# Patient Record
Sex: Female | Born: 1945 | Race: White | Hispanic: No | Marital: Married | Smoking: Former smoker
Health system: Southern US, Community
[De-identification: ages and names within clinical notes are randomized; demographics above are authoritative.]

## PROBLEM LIST (undated history)

## (undated) DIAGNOSIS — C801 Malignant (primary) neoplasm, unspecified: Secondary | ICD-10-CM

## (undated) HISTORY — PX: ABDOMINAL HYSTERECTOMY: SHX81

---

## 2013-08-07 DIAGNOSIS — C569 Malignant neoplasm of unspecified ovary: Secondary | ICD-10-CM | POA: Insufficient documentation

## 2018-02-27 ENCOUNTER — Telehealth: Payer: Self-pay | Admitting: Hematology & Oncology

## 2018-02-27 NOTE — Telephone Encounter (Signed)
Received call from palliative care to schedule patient with Dr Marin Olp for a new patient appt. Per Dr Kerrie Buffalo she has spoken to Dr E and he has agreed to see patient as she transfers from [place]. Pt want to receive treatments closer to home. Called patent and gathered as much demographics as I could and we are awaiting medical records. Pt s aware of 3/15 at 3 pm appt dae.time

## 2018-03-08 ENCOUNTER — Ambulatory Visit (HOSPITAL_BASED_OUTPATIENT_CLINIC_OR_DEPARTMENT_OTHER)
Admission: RE | Admit: 2018-03-08 | Discharge: 2018-03-08 | Disposition: A | Discharge status: Home / Self Care | Payer: Medicare Other | Source: Ambulatory Visit | Attending: Hematology & Oncology | Admitting: Hematology & Oncology

## 2018-03-08 ENCOUNTER — Inpatient Hospital Stay: Payer: Medicare Other | Attending: Hematology & Oncology | Admitting: Hematology & Oncology

## 2018-03-08 ENCOUNTER — Other Ambulatory Visit: Payer: Self-pay

## 2018-03-08 ENCOUNTER — Inpatient Hospital Stay: Payer: Medicare Other

## 2018-03-08 ENCOUNTER — Encounter: Payer: Self-pay | Admitting: Hematology & Oncology

## 2018-03-08 VITALS — BP 129/69 | HR 80 | Temp 98.1°F | Resp 16 | Wt 172.0 lb

## 2018-03-08 DIAGNOSIS — I2510 Atherosclerotic heart disease of native coronary artery without angina pectoris: Secondary | ICD-10-CM | POA: Insufficient documentation

## 2018-03-08 DIAGNOSIS — C569 Malignant neoplasm of unspecified ovary: Secondary | ICD-10-CM

## 2018-03-08 DIAGNOSIS — R188 Other ascites: Secondary | ICD-10-CM | POA: Insufficient documentation

## 2018-03-08 DIAGNOSIS — J9811 Atelectasis: Secondary | ICD-10-CM | POA: Diagnosis not present

## 2018-03-08 DIAGNOSIS — Z87891 Personal history of nicotine dependence: Secondary | ICD-10-CM

## 2018-03-08 DIAGNOSIS — R0602 Shortness of breath: Secondary | ICD-10-CM | POA: Insufficient documentation

## 2018-03-08 DIAGNOSIS — C786 Secondary malignant neoplasm of retroperitoneum and peritoneum: Secondary | ICD-10-CM | POA: Insufficient documentation

## 2018-03-08 DIAGNOSIS — Z7982 Long term (current) use of aspirin: Secondary | ICD-10-CM | POA: Diagnosis not present

## 2018-03-08 LAB — CMP (CANCER CENTER ONLY)
ALT: 19 U/L (ref 10–47)
ANION GAP: 5 (ref 5–15)
AST: 28 U/L (ref 11–38)
Albumin: 3.5 g/dL (ref 3.5–5.0)
Alkaline Phosphatase: 76 U/L (ref 26–84)
BUN: 14 mg/dL (ref 7–22)
CHLORIDE: 106 mmol/L (ref 98–108)
CO2: 31 mmol/L (ref 18–33)
Calcium: 9.7 mg/dL (ref 8.0–10.3)
Creatinine: 0.8 mg/dL (ref 0.60–1.20)
GLUCOSE: 107 mg/dL (ref 73–118)
POTASSIUM: 4 mmol/L (ref 3.3–4.7)
SODIUM: 142 mmol/L (ref 128–145)
TOTAL PROTEIN: 6.6 g/dL (ref 6.4–8.1)
Total Bilirubin: 0.6 mg/dL (ref 0.2–1.6)

## 2018-03-08 LAB — CBC WITH DIFFERENTIAL (CANCER CENTER ONLY)
Basophils Absolute: 0 10*3/uL (ref 0.0–0.1)
Basophils Relative: 0 %
EOS ABS: 0.2 10*3/uL (ref 0.0–0.5)
EOS PCT: 4 %
HCT: 35.9 % (ref 34.8–46.6)
Hemoglobin: 11.6 g/dL (ref 11.6–15.9)
LYMPHS ABS: 1.1 10*3/uL (ref 0.9–3.3)
LYMPHS PCT: 21 %
MCH: 30.5 pg (ref 26.0–34.0)
MCHC: 32.3 g/dL (ref 32.0–36.0)
MCV: 94.5 fL (ref 81.0–101.0)
Monocytes Absolute: 0.6 10*3/uL (ref 0.1–0.9)
Monocytes Relative: 11 %
Neutro Abs: 3.5 10*3/uL (ref 1.5–6.5)
Neutrophils Relative %: 64 %
PLATELETS: 283 10*3/uL (ref 145–400)
RBC: 3.8 MIL/uL (ref 3.70–5.32)
RDW: 14.9 % (ref 11.1–15.7)
WBC: 5.4 10*3/uL (ref 3.9–10.0)

## 2018-03-08 MED ORDER — IOPAMIDOL (ISOVUE-370) INJECTION 76%
100.0000 mL | Freq: Once | INTRAVENOUS | Status: AC | PRN
  Administered 2018-03-08: 100 mL via INTRAVENOUS

## 2018-03-08 NOTE — Progress Notes (Signed)
Referral MD  Reason for Referral: Recurrent ovarian cancer with peritoneal carcinomatosis   Chief Complaint  Patient presents with  . New Patient (Initial Visit)  : I am having trouble breathing.  HPI: Mary Osborn is a very nice 72 year old white female.  She has been in decent health.  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  A CT scan was done which showed an abnormality in the left adnexal region.  There was fluid in the pelvic cul-de-sac.  Her CA 125 was reportedly elevated.  She underwent an exploratory laparotomy with TAH/BSO on 07/11/2013.  She was found to have a stage IIIc low-grade serous carcinoma involving both ovaries.  She had positive pelvic washings.  1 of 2 pelvic lymph nodes had metastatic cancer.  She underwent 6 cycles of adjuvant chemotherapy with carboplatinum/Taxol that was completed on 12/26/2014.   In June 2016, her CA 125 was rising.  CT scan showed peritoneal carcinomatosis.  She was started on Femara.  She remains on Femara until 11/2017.  A biopsy was done of a right axillary lymph node.  This showed low-grade ovarian carcinoma.  She was then started on Megace.  She had a very hard time with Megace.  Her CA-125 continue to rise.  She was started on chemotherapy with carboplatinum/Taxol on 02/21/2018.  She tried to have testing [facility] for [facility] 2018.  However there was not enough material to be sent.  She drives to [facility].  It is becoming more difficult for her to go there.  Her next treatment is on Monday.  Her main complaint has been shortness of breath.  She cannot go up a flight of stairs.  She cannot walk more than 200 feet.  She has no cough.  There is no fever.  She has no chest pain.  I looked at her CT scan report that was done back in there was nothing that was obvious with fluid in the lung.  Her heart looked okay.  She comes in with her husband.  She does not complain of any pain.  There  may be some occasional abdominal pain.  She is not having diarrhea.  She is not constipated.  There is no leg swelling.  She is had no fever.  There is been no bleeding.  She used to smoke.  She stopped a few years ago.  She probably has a 40-pack-year history of tobacco use.  There is been no rashes.  Overall, I said her performance status is ECOG 1.  History reviewed. No pertinent past medical history.:  History reviewed. No pertinent surgical history.:   Current Outpatient Medications:  .  aspirin EC 81 MG tablet, Take 81 mg by mouth daily., Disp: , Rfl:  .  atorvastatin (LIPITOR) 20 MG tablet, Take 20 mg by mouth. Take 20 mg by mouth twice a week., Disp: , Rfl:  .  b complex vitamins tablet, Take 1 tablet by mouth daily., Disp: , Rfl:  .  Coenzyme Q10 (CO Q-10 PO), Take 10 mg by mouth daily., Disp: , Rfl:  .  docusate sodium (STOOL SOFTENER) 250 MG capsule, Take 250 mg by mouth 2 (two) times daily., Disp: , Rfl:  .  levothyroxine (SYNTHROID, LEVOTHROID) 125 MCG tablet, Take 125 mcg by mouth daily before breakfast., Disp: , Rfl:  .  magnesium hydroxide (MILK OF MAGNESIA) 400 MG/5ML suspension, Take by mouth daily as needed for mild constipation. Take by mouth once daily as  needed for constipation., Disp: , Rfl:  .  magnesium oxide (MAG-OX) 400 MG tablet, Take 400 mg by mouth daily., Disp: , Rfl:  .  meloxicam (MOBIC) 15 MG tablet, Take 15 mg by mouth daily., Disp: , Rfl:  .  Misc Natural Products (OSTEO BI-FLEX JOINT SHIELD PO), Take 1 capsule by mouth daily., Disp: , Rfl:  .  Multiple Vitamin (MULTIVITAMIN) tablet, Take 1 tablet by mouth daily., Disp: , Rfl:  .  omega-3 fish oil (MAXEPA) 1000 MG CAPS capsule, Take 1 capsule by mouth daily., Disp: , Rfl:  .  sertraline (ZOLOFT) 50 MG tablet, Take 50 mg by mouth daily., Disp: , Rfl:  .  traZODone (DESYREL) 100 MG tablet, Take 100 mg by mouth at bedtime., Disp: , Rfl: :  :  Allergies  Allergen Reactions  . Codeine Nausea And  Vomiting, Nausea Only and Swelling  . Meperidine Nausea And Vomiting  . Sulfa Antibiotics Itching and Swelling  . Sulfamethoxazole Hives  . Sulfur Nausea Only  :  History reviewed. No pertinent family history.:  Social History   Socioeconomic History  . Marital status: Married    Spouse name: Not on file  . Number of children: Not on file  . Years of education: Not on file  . Highest education level: Not on file  Social Needs  . Financial resource strain: Not on file  . Food insecurity - worry: Not on file  . Food insecurity - inability: Not on file  . Transportation needs - medical: Not on file  . Transportation needs - non-medical: Not on file  Occupational History  . Not on file  Tobacco Use  . Smoking status: Former Smoker    Packs/day: 1.00    Types: Cigarettes    Last attempt to quit: 02/22/2008    Years since quitting: 10.0  . Smokeless tobacco: Never Used  Substance and Sexual Activity  . Alcohol use: Not on file  . Drug use: No  . Sexual activity: Not on file  Other Topics Concern  . Not on file  Social History Narrative  . Not on file  :  Review of Systems  Constitutional: Positive for malaise/fatigue.  HENT: Negative.   Eyes: Negative.   Respiratory: Positive for shortness of breath.   Cardiovascular: Negative.   Gastrointestinal: Positive for abdominal pain.  Genitourinary: Negative.   Musculoskeletal: Negative.   Skin: Negative.   Neurological: Negative.   Endo/Heme/Allergies: Negative.   Psychiatric/Behavioral: Negative.    .  Exam: Well-developed and well-nourished white female in no obvious distress.  Vital signs show a temperature of 98.1.  Pulse 80.  Blood pressure 129/69.  Weight is 172 pounds.  Head neck exam shows no ocular or oral lesions.  There are no palpable cervical or supraclavicular lymph nodes.  Lungs are clear bilaterally.  She has good air movement bilaterally.  No wheezes are noted.  There might be a little bit of a pleural rub  on the left.  Cardiac exam tachycardic but regular.  Abdomen is soft.  She has well healed laparoscopy scars.  There is no fluid wave.  There is no obvious abdominal mass.  She has no palpable liver or spleen tip.  Back exam shows no tenderness over the spine, ribs or hips.  Extremities shows no clubbing, cyanosis or edema.  She has no venous cord in the legs.  She has a negative Homans sign bilaterally.  Skin exam shows some scattered ecchymoses.  Neurological exam shows no focal neurological  deficits.  @IPVITALS @   Recent Labs    03/08/18 1544  WBC 5.4  HCT 35.9  PLT 283   Recent Labs    03/08/18 1544  NA 142  K 4.0  CL 106  CO2 31  GLUCOSE 107  BUN 14  CREATININE 0.80  CALCIUM 9.7    Blood smear review: None   Pathology: None     Assessment and Plan: Mary Osborn is a 72 year old white female.  She has recurrent ovarian cancer.  I think this probably would be her third recurrence.  She now is back on chemotherapy with carboplatinum/Taxol.  I am worried about this shortness of breath.  I think that we have to do a CT angiogram to make sure that there is no pulmonary emboli.  I think this would be unusual but yet I think we do have to check this.  Her renal function is okay so IV dye would be okay.  If her CT angiogram is negative for pulmonary emboli, then I would strongly consider an echocardiogram looking at her heart function.  I do not see anything clinically that would suggest congestive heart failure.  However, with her treatments that she has had, cardiac dysfunction could be a factor.  She obviously is getting great care from Dr. Theora Gianotti at [facility].  I try to reassure Mary Osborn that she is getting the same kind of treatment that I would have recommended.  She goes back to [facility] on Monday for next cycle of treatment.  We are always available for Mary Osborn.  If she does progress, then there is certainly our other options that we can consider.  She has not had  Avastin.  She has not had gemcitabine based therapy.  She is not had nirapinib which is a PARP inhibitor that has activity in patients without a BRCA mutation.  I spent about an hour with she and her husband.  Over 50% of the time was spent face-to-face with them.  We will be more than happy to see her back in the event that it becomes too difficult for her to get to [facility]

## 2018-03-09 LAB — CA 125: Cancer Antigen (CA) 125: 253.8 U/mL — ABNORMAL HIGH (ref 0.0–38.1)

## 2018-03-11 ENCOUNTER — Encounter: Payer: Self-pay | Admitting: Hematology & Oncology

## 2018-03-11 ENCOUNTER — Other Ambulatory Visit: Payer: Self-pay | Admitting: Hematology & Oncology

## 2018-03-11 DIAGNOSIS — R0602 Shortness of breath: Secondary | ICD-10-CM

## 2018-03-11 LAB — LACTATE DEHYDROGENASE: LDH: 374 U/L — AB (ref 125–245)

## 2018-03-13 ENCOUNTER — Other Ambulatory Visit: Payer: Self-pay

## 2018-03-13 ENCOUNTER — Ambulatory Visit (HOSPITAL_BASED_OUTPATIENT_CLINIC_OR_DEPARTMENT_OTHER)
Admission: RE | Admit: 2018-03-13 | Discharge: 2018-03-13 | Disposition: A | Discharge status: Home / Self Care | Payer: Medicare Other | Source: Ambulatory Visit | Attending: Hematology & Oncology | Admitting: Hematology & Oncology

## 2018-03-13 ENCOUNTER — Encounter (HOSPITAL_COMMUNITY): Payer: Self-pay | Admitting: *Deleted

## 2018-03-13 ENCOUNTER — Emergency Department (HOSPITAL_COMMUNITY): Payer: Medicare Other

## 2018-03-13 ENCOUNTER — Observation Stay (HOSPITAL_COMMUNITY)
Admission: EM | Admit: 2018-03-13 | Discharge: 2018-03-14 | Disposition: A | Discharge status: Home / Self Care | Payer: Medicare Other | Attending: Internal Medicine | Admitting: Internal Medicine

## 2018-03-13 DIAGNOSIS — J9611 Chronic respiratory failure with hypoxia: Principal | ICD-10-CM | POA: Insufficient documentation

## 2018-03-13 DIAGNOSIS — I272 Pulmonary hypertension, unspecified: Secondary | ICD-10-CM | POA: Diagnosis present

## 2018-03-13 DIAGNOSIS — C786 Secondary malignant neoplasm of retroperitoneum and peritoneum: Secondary | ICD-10-CM

## 2018-03-13 DIAGNOSIS — Z515 Encounter for palliative care: Secondary | ICD-10-CM

## 2018-03-13 DIAGNOSIS — I4581 Long QT syndrome: Secondary | ICD-10-CM | POA: Insufficient documentation

## 2018-03-13 DIAGNOSIS — R0602 Shortness of breath: Secondary | ICD-10-CM

## 2018-03-13 DIAGNOSIS — Z87891 Personal history of nicotine dependence: Secondary | ICD-10-CM | POA: Diagnosis not present

## 2018-03-13 DIAGNOSIS — R0902 Hypoxemia: Secondary | ICD-10-CM

## 2018-03-13 DIAGNOSIS — I2510 Atherosclerotic heart disease of native coronary artery without angina pectoris: Secondary | ICD-10-CM | POA: Insufficient documentation

## 2018-03-13 DIAGNOSIS — Z79890 Hormone replacement therapy (postmenopausal): Secondary | ICD-10-CM | POA: Insufficient documentation

## 2018-03-13 DIAGNOSIS — R0600 Dyspnea, unspecified: Secondary | ICD-10-CM

## 2018-03-13 DIAGNOSIS — R9431 Abnormal electrocardiogram [ECG] [EKG]: Secondary | ICD-10-CM | POA: Diagnosis present

## 2018-03-13 DIAGNOSIS — E039 Hypothyroidism, unspecified: Secondary | ICD-10-CM | POA: Insufficient documentation

## 2018-03-13 DIAGNOSIS — Z7969 Long term (current) use of other immunomodulators and immunosuppressants: Secondary | ICD-10-CM

## 2018-03-13 DIAGNOSIS — Z7982 Long term (current) use of aspirin: Secondary | ICD-10-CM | POA: Insufficient documentation

## 2018-03-13 DIAGNOSIS — Z79899 Other long term (current) drug therapy: Secondary | ICD-10-CM | POA: Diagnosis not present

## 2018-03-13 DIAGNOSIS — I071 Rheumatic tricuspid insufficiency: Secondary | ICD-10-CM | POA: Insufficient documentation

## 2018-03-13 DIAGNOSIS — R0689 Other abnormalities of breathing: Secondary | ICD-10-CM

## 2018-03-13 DIAGNOSIS — I872 Venous insufficiency (chronic) (peripheral): Secondary | ICD-10-CM

## 2018-03-13 DIAGNOSIS — C569 Malignant neoplasm of unspecified ovary: Secondary | ICD-10-CM | POA: Diagnosis not present

## 2018-03-13 DIAGNOSIS — I700 Atherosclerosis of aorta: Secondary | ICD-10-CM | POA: Diagnosis not present

## 2018-03-13 DIAGNOSIS — Z9049 Acquired absence of other specified parts of digestive tract: Secondary | ICD-10-CM | POA: Diagnosis not present

## 2018-03-13 DIAGNOSIS — N6022 Fibroadenosis of left breast: Secondary | ICD-10-CM | POA: Diagnosis present

## 2018-03-13 DIAGNOSIS — G5793 Unspecified mononeuropathy of bilateral lower limbs: Secondary | ICD-10-CM | POA: Diagnosis present

## 2018-03-13 DIAGNOSIS — Z791 Long term (current) use of non-steroidal anti-inflammatories (NSAID): Secondary | ICD-10-CM | POA: Insufficient documentation

## 2018-03-13 DIAGNOSIS — J9691 Respiratory failure, unspecified with hypoxia: Secondary | ICD-10-CM

## 2018-03-13 HISTORY — DX: Malignant (primary) neoplasm, unspecified: C80.1

## 2018-03-13 LAB — BASIC METABOLIC PANEL
Anion gap: 11 (ref 5–15)
BUN: 16 mg/dL (ref 6–20)
CALCIUM: 9.3 mg/dL (ref 8.9–10.3)
CO2: 21 mmol/L — ABNORMAL LOW (ref 22–32)
CREATININE: 0.82 mg/dL (ref 0.44–1.00)
Chloride: 104 mmol/L (ref 101–111)
GFR calc non Af Amer: >60 mL/min (ref >60)
Glucose, Bld: 129 mg/dL — ABNORMAL HIGH (ref 65–99)
Potassium: 4 mmol/L (ref 3.5–5.1)
SODIUM: 136 mmol/L (ref 135–145)

## 2018-03-13 LAB — CBC
HCT: 36.4 % (ref 36.0–46.0)
Hemoglobin: 11.7 g/dL — ABNORMAL LOW (ref 12.0–15.0)
MCH: 29.8 pg (ref 26.0–34.0)
MCHC: 32.1 g/dL (ref 30.0–36.0)
MCV: 92.6 fL (ref 78.0–100.0)
PLATELETS: 236 10*3/uL (ref 150–400)
RBC: 3.93 MIL/uL (ref 3.87–5.11)
RDW: 16.2 % — AB (ref 11.5–15.5)
WBC: 7 10*3/uL (ref 4.0–10.5)

## 2018-03-13 LAB — I-STAT TROPONIN, ED: TROPONIN I, POC: 0.05 ng/mL (ref 0.00–0.08)

## 2018-03-13 MED ORDER — ENOXAPARIN SODIUM 40 MG/0.4ML ~~LOC~~ SOLN: 40.0000 mg | Freq: Every day | SUBCUTANEOUS | Status: DC

## 2018-03-13 MED ORDER — ACETAMINOPHEN 325 MG PO TABS
650.0000 mg | ORAL_TABLET | Freq: Four times a day (QID) | ORAL | Status: DC | PRN
  Administered 2018-03-14: 650 mg via ORAL
  Filled 2018-03-13: qty 2

## 2018-03-13 MED ORDER — LEVOTHYROXINE SODIUM 25 MCG PO TABS
125.0000 ug | ORAL_TABLET | Freq: Every day | ORAL | Status: DC
  Filled 2018-03-13: qty 1

## 2018-03-13 MED ORDER — TRAZODONE HCL 100 MG PO TABS
100.0000 mg | ORAL_TABLET | Freq: Every day | ORAL | Status: DC
  Filled 2018-03-13: qty 1

## 2018-03-13 MED ORDER — SERTRALINE HCL 50 MG PO TABS
50.0000 mg | ORAL_TABLET | Freq: Every day | ORAL | Status: DC
  Filled 2018-03-13 (×2): qty 1

## 2018-03-13 MED ORDER — ACETAMINOPHEN 650 MG RE SUPP: 650.0000 mg | Freq: Four times a day (QID) | RECTAL | Status: DC | PRN

## 2018-03-13 MED ORDER — SENNOSIDES-DOCUSATE SODIUM 8.6-50 MG PO TABS: 1.0000 | ORAL_TABLET | Freq: Every evening | ORAL | Status: DC | PRN

## 2018-03-13 MED ORDER — ASPIRIN EC 81 MG PO TBEC
81.0000 mg | DELAYED_RELEASE_TABLET | Freq: Every day | ORAL | Status: DC
  Administered 2018-03-14: 81 mg via ORAL
  Filled 2018-03-13: qty 1

## 2018-03-13 NOTE — ED Triage Notes (Signed)
To ED for further eval of sob. Pt is currently receiving chemo for ovarian cancer. Due tomorrow for next dose of Taxol. Per pt and husband, pt has been evaluated for recently with CT Scan x2 to r/o PE- one in [place] and one at [facility]. This am pt had ECG at pcps office and told to come to ED for eval of abnormal result. No cp. Appears in nad. Pt found by EMT to have sat 89%. Place on 2L [place] and sats up to 93%

## 2018-03-13 NOTE — ED Provider Notes (Signed)
[place] EMERGENCY DEPARTMENT Provider Note   CSN: [id number] Arrival date & time: 03/13/18  1526     History   Chief Complaint Chief Complaint  Patient presents with  . Shortness of Breath    HPI Mary Osborn is a 72 y.o. female.    She presents for evaluation of shortness of breath ongoing for several months, and having been evaluated several times for same.  Today she had a echocardiogram done, and after getting the results, her oncologist at [facility], suggested that she come to the emergency department for evaluation and treatment.  She was at home when this recommendation arrived, and an ambulance was summoned.  She was found to be toxic, 89% on room air.  She was then placed on nasal cannula oxygen at 2 L with improvement to 93%.  The patient reports dyspnea on exertion for several weeks.  Occasionally she has periods when she has to gasp to breathe.  She is getting ongoing evaluation and treatment by oncology, for indolent ovarian cancer, and currently receiving chemotherapy.  She was evaluated 2 days ago by the oncology service at [facility], and had CTA chest to evaluate for PE which did not show PE.  She had a similar test 5 days ago here at [facility].  She has occasional cough which is nonproductive.  She has difficulty sleeping at night because of leg pain.  She takes trazodone without being able to sleep.  She denies fever, chills, cough, nausea, vomiting, focal weakness or paresthesia.  She is due for chemo tomorrow but it has been canceled.  There are no other known modifying factors.    ECHO Results today: - 1. Left ventricular systolic function is preserved visually   estimated at 60-55%. Left ventricular relaxation.   2. Moderate tricuspid regurgitation.   3. Calculated pulmonary artery systolic pressure approximately 70   mmHg. Significant pulmonary hypertension..   4. Right ventricle exhibits hypertrophy and is dilated and  systolic function appears reduced.   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  HPI  Past Medical History:  Diagnosis Date  . Cancer ([place])     There are no active problems to display for this patient.   Past Surgical History:  Procedure Laterality Date  . ABDOMINAL HYSTERECTOMY      OB History    No data available       Home Medications    Prior to Admission medications   Medication Sig Start Date End Date Taking? Authorizing Provider  aspirin EC 81 MG tablet Take 81 mg by mouth daily.   Yes [provider]  atorvastatin (LIPITOR) 20 MG tablet Take 20 mg by mouth. Take 20 mg by mouth twice a week.   Yes [provider]  b complex vitamins tablet Take 1 tablet by mouth daily.   Yes [provider]  Coenzyme Q10 (CO Q-10 PO) Take 10 mg by mouth daily.   Yes [provider]  dexamethasone (DECADRON) 4 MG tablet Take 8 mg by mouth 2 (two) times daily with a meal.   Yes [provider]  docusate sodium (STOOL SOFTENER) 250 MG capsule Take 250 mg by mouth 2 (two) times daily.   Yes [provider]  levothyroxine (SYNTHROID, LEVOTHROID) 125 MCG tablet Take 125 mcg by mouth daily before breakfast.   Yes [provider]  magnesium hydroxide (MILK OF MAGNESIA) 400 MG/5ML suspension Take by mouth daily as needed for mild constipation. Take by mouth once daily as needed  for constipation.   Yes [provider]  magnesium oxide (MAG-OX) 400 MG tablet Take 400 mg by mouth daily.   Yes [provider]  meloxicam (MOBIC) 15 MG tablet Take 15 mg by mouth at bedtime.    Yes [provider]  Misc Natural Products (OSTEO BI-FLEX JOINT SHIELD PO) Take 1 capsule by mouth at bedtime.    Yes [provider]  Multiple Vitamin (MULTIVITAMIN) tablet Take 1 tablet by mouth daily.   Yes [provider]  OLANZapine (ZYPREXA) 10 MG tablet Take 10 mg by mouth at bedtime.   Yes [provider]  omega-3 fish oil  (MAXEPA) 1000 MG CAPS capsule Take 1 capsule by mouth daily.   Yes [provider]  prochlorperazine (COMPAZINE) 10 MG tablet Take 10 mg by mouth every 6 (six) hours as needed for nausea or vomiting.   Yes [provider]  sertraline (ZOLOFT) 50 MG tablet Take 50 mg by mouth at bedtime.    Yes [provider]  traZODone (DESYREL) 100 MG tablet Take 100 mg by mouth at bedtime.   Yes [provider]    Family History No family history on file.  Social History Social History   Tobacco Use  . Smoking status: Former Smoker    Packs/day: 1.00    Types: Cigarettes    Last attempt to quit: 02/22/2008    Years since quitting: 10.0  . Smokeless tobacco: Never Used  Substance Use Topics  . Alcohol use: Not on file  . Drug use: No     Allergies   Codeine; Meperidine; Sulfa antibiotics; Sulfamethoxazole; and Sulfur   Review of Systems Review of Systems  All other systems reviewed and are negative.    Physical Exam Updated Vital Signs BP 124/82   Pulse 92   Temp (!) 97.5 F (36.4 C) (Oral)   Resp (!) 26   SpO2 94%   Physical Exam  Constitutional: She is oriented to person, place, and time. She appears well-developed and well-nourished.  HENT:  Head: Normocephalic and atraumatic.  Eyes: Conjunctivae and EOM are normal. Pupils are equal, round, and reactive to light.  Neck: Normal range of motion and phonation normal. Neck supple.  Cardiovascular: Normal rate and regular rhythm.  Pulmonary/Chest: Effort normal and breath sounds normal. She exhibits no tenderness.  Abdominal: Soft. She exhibits no distension. There is no tenderness. There is no guarding.  Musculoskeletal: Normal range of motion.  Neurological: She is alert and oriented to person, place, and time. She exhibits normal muscle tone.  Skin: Skin is warm and dry.  Psychiatric: She has a normal mood and affect. Her behavior is normal. Judgment and thought content normal.  Nursing  note and vitals reviewed.    ED Treatments / Results  Labs (all labs ordered are listed, but only abnormal results are displayed) Labs Reviewed  BASIC METABOLIC PANEL - Abnormal; Notable for the following components:      Result Value   CO2 21 (*)    Glucose, Bld 129 (*)    All other components within normal limits  CBC - Abnormal; Notable for the following components:   Hemoglobin 11.7 (*)    RDW 16.2 (*)    All other components within normal limits  I-STAT TROPONIN, ED    EKG  EKG Interpretation  Date/Time:  Wednesday March 13 2018 15:38:18 EDT Ventricular Rate:  98 PR Interval:  138 QRS Duration: 82 QT Interval:  410 QTC Calculation: 523 R Axis:   -  80 Text Interpretation:  Normal sinus rhythm Left axis deviation Anterior infarct , age undetermined T wave abnormality, consider inferior ischemia Prolonged QT Abnormal ECG No old tracing to compare Confirmed by Daleen Bo [phone]) on 03/13/2018 8:24:05 PM       Radiology Dg Chest 2 View  Result Date: 03/13/2018 CLINICAL DATA:  Shortness of breath for 6 weeks. EXAM: CHEST - 2 VIEW COMPARISON:  CT chest 03/08/2018. FINDINGS: Minimal subsegmental atelectasis left lung base noted. The lungs are otherwise clear. Heart size is normal. No pneumothorax or pleural effusion. Aortic atherosclerosis is seen. No acute bony abnormality. IMPRESSION: No acute disease. Atherosclerosis. Electronically Signed   By: Inge Rise M.D.   On: 03/13/2018 16:44    Procedures Procedures (including critical care time)  Medications Ordered in ED Medications - No data to display   Initial Impression / Assessment and Plan / ED Course  I have reviewed the triage vital signs and the nursing notes.  Pertinent labs & imaging results that were available during my care of the patient were reviewed by me and considered in my medical decision making (see chart for details).  Clinical Course as of Mar 13 2204  Wed Mar 13, 2018  2204 Evaluation  for dyspnea: Normal troponin, normal white count, hemoglobin low 11.7, normal sodium, normal potassium, normal creatinine.  Chest x-ray no acute disease.  [EW]  2205 Oxygen saturation assessment, maintains normal saturation with 3 L nasal cannula at 95%.  [EW]    Clinical Course User Index [EW] Daleen Bo, MD     Patient Vitals for the past 24 hrs:  BP Temp Temp src Pulse Resp SpO2  03/13/18 2100 124/82 - - 92 (!) 26 94 %  03/13/18 2030 122/85 - - 93 (!) 29 95 %  03/13/18 2005 117/80 - - (!) 103 19 97 %  03/13/18 1819 108/72 - - 96 16 94 %  03/13/18 1532 102/65 (!) 97.5 F (36.4 C) Oral 97 20 (!) 89 %    10:04 PM Reevaluation with update and discussion. After initial assessment and treatment, an updated evaluation reveals no change in clinical status, findings discussed with patient and husband all questions were answered. Daleen Bo   MDM-shortness of breath with hypoxia, and new finding for significant pulmonary hypertension.  Patient requires oxygen for comfort.  She can potentially have an outpatient workup for pulmonary hypertension with hypoxia, but is unable to go home without oxygen.  Case management has evaluated the patient but has been unable to get oxygen for her to leave the emergency department, with.  Therefore she will be admitted on observation status for treatment.  10:09 PM-Consult complete with on-call resident. Patient case explained and discussed.  She agrees to admit patient for further evaluation and treatment. Call ended at 10:42 PM   Final Clinical Impressions(s) / ED Diagnoses   Final diagnoses:  Hypoxia  Pulmonary hypertension [facility])    ED Discharge Orders    None       Daleen Bo, MD 03/13/18 2243

## 2018-03-13 NOTE — ED Notes (Signed)
No reply for vitals x4

## 2018-03-13 NOTE — H&P (Signed)
Date: 03/14/2018               Patient Name:  Mary Osborn MRN: [id number]  DOB: [birth year] Age / Sex: 72 y.o., female   PCP: Lemar Livings., MD         Medical Service: Internal Medicine Teaching Service         Attending Physician: Dr. Lucious Groves, DO    First Contact: Dr. Burtis Junes Pager: [phone]  Second Contact: Dr. Alphonzo Grieve Pager: [phone]       After Hours (After 5p/  First Contact Pager: [phone]  weekends / holidays): Second Contact Pager: [phone]   Chief Complaint: SOB  History of Present Illness: 72 yo female with PMH of ovarian cancer diagnosed 2014 s/p chemotherapy treatment 3 meds over last 5 years, last treatment 3 weeks ago, ?PAF.  She presents with a 6 week history of shortness of breath with exertion.  She feels short of breath just getting dressed in the morning.  She has denied any chest pain, LE swelling or pain, weight gain, orthopnea.  She endorses difficulty sleeping the last several nights but does not wake up short of breath.  She does not take long trips and has had no recent immobility.  An outpatient echo was done here at [facility] to help evaluate her shortness of breath and her oncologist from [facility] read this report and told the patient to come to the ED right away.   ED course: Pt arrived with no acute changes in her shortness of breath.  She was scheduled to be discharged for outpatient [place] workup however she was mildly hypoxemic 89% at rest.    Meds:  Current Meds  Medication Sig  . aspirin EC 81 MG tablet Take 81 mg by mouth daily.  Marland Kitchen atorvastatin (LIPITOR) 20 MG tablet Take 20 mg by mouth. Take 20 mg by mouth twice a week.  Marland Kitchen b complex vitamins tablet Take 1 tablet by mouth daily.  . Coenzyme Q10 (CO Q-10 PO) Take 10 mg by mouth daily.  Marland Kitchen dexamethasone (DECADRON) 4 MG tablet Take 8 mg by mouth 2 (two) times daily with a meal.  . docusate sodium (STOOL SOFTENER) 250 MG capsule Take 250 mg by mouth 2 (two) times daily.  Marland Kitchen  levothyroxine (SYNTHROID, LEVOTHROID) 125 MCG tablet Take 125 mcg by mouth daily before breakfast.  . magnesium hydroxide (MILK OF MAGNESIA) 400 MG/5ML suspension Take by mouth daily as needed for mild constipation. Take by mouth once daily as needed for constipation.  . magnesium oxide (MAG-OX) 400 MG tablet Take 400 mg by mouth daily.  . meloxicam (MOBIC) 15 MG tablet Take 15 mg by mouth at bedtime.   . Misc Natural Products (OSTEO BI-FLEX JOINT SHIELD PO) Take 1 capsule by mouth at bedtime.   . Multiple Vitamin (MULTIVITAMIN) tablet Take 1 tablet by mouth daily.  Marland Kitchen OLANZapine (ZYPREXA) 10 MG tablet Take 10 mg by mouth at bedtime.  Marland Kitchen omega-3 fish oil (MAXEPA) 1000 MG CAPS capsule Take 1 capsule by mouth daily.  . prochlorperazine (COMPAZINE) 10 MG tablet Take 10 mg by mouth every 6 (six) hours as needed for nausea or vomiting.  . sertraline (ZOLOFT) 50 MG tablet Take 50 mg by mouth at bedtime.   . traZODone (DESYREL) 100 MG tablet Take 100 mg by mouth at bedtime.     Allergies: Allergies as of 03/13/2018 - Review Complete 03/13/2018  Allergen Reaction Noted  . Codeine Nausea And Vomiting,  Nausea Only, and Swelling 01/01/2013  . Meperidine Nausea And Vomiting 07/09/2013  . Sulfa antibiotics Itching and Swelling   . Sulfamethoxazole Hives 01/01/2013  . Sulfur Nausea Only 07/01/2013   Past Medical History:  Diagnosis Date  . Cancer [facility]     Family History: No family history on file. Strokes in mother and grandmother both in 49's No connective tissue disease or autoimmune family history   Social History: Social History   Socioeconomic History  . Marital status: Married    Spouse name: Not on file  . Number of children: Not on file  . Years of education: Not on file  . Highest education level: Not on file  Occupational History  . Not on file  Social Needs  . Financial resource strain: Not on file  . Food insecurity:    Worry: Not on file    Inability: Not on file  .  Transportation needs:    Medical: Not on file    Non-medical: Not on file  Tobacco Use  . Smoking status: Former Smoker    Packs/day: 1.00    Types: Cigarettes    Last attempt to quit: 02/22/2008    Years since quitting: 10.0  . Smokeless tobacco: Never Used  Substance and Sexual Activity  . Alcohol use: Not on file  . Drug use: No  . Sexual activity: Not on file  Lifestyle  . Physical activity:    Days per week: Not on file    Minutes per session: Not on file  . Stress: Not on file  Relationships  . Social connections:    Talks on phone: Not on file    Gets together: Not on file    Attends religious service: Not on file    Active member of club or organization: Not on file    Attends meetings of clubs or organizations: Not on file    Relationship status: Not on file  . Intimate partner violence:    Fear of current or ex partner: Not on file    Emotionally abused: Not on file    Physically abused: Not on file    Forced sexual activity: Not on file  Other Topics Concern  . Not on file  Social History Narrative  . Not on file    Review of Systems: A complete ROS was negative except as per HPI.   Physical Exam: Blood pressure (!) 112/92, pulse (!) 103, temperature (!) 97.5 F (36.4 C), temperature source Oral, resp. rate (!) 29, SpO2 94 %. Physical Exam  Constitutional: She is oriented to person, place, and time. No distress.  HENT:  Head: Normocephalic and atraumatic.  Neck: Hepatojugular reflux and JVD (to angle of mandible) present.  Cardiovascular: Normal rate and regular rhythm. Exam reveals no gallop and no friction rub.  Distant heart sounds  Pulmonary/Chest: Effort normal and breath sounds normal. No respiratory distress. She has no wheezes. She has no rales. She exhibits no tenderness.  Abdominal: Soft. Bowel sounds are normal. She exhibits no distension and no mass. There is no tenderness. There is no rebound and no guarding.  Musculoskeletal:       Right  lower leg: She exhibits edema (trace).       Left lower leg: She exhibits edema (trace).  Neurological: She is alert and oriented to person, place, and time.  Skin: She is not diaphoretic.  Psychiatric: She has a normal mood and affect. Her behavior is normal.    EKG: personally reviewed my interpretation  is sinus rhythm, normal rate, LAD, Poor R wave progression, Prolonged QT interval  CXR: personally reviewed my interpretation is mild left lower lobe atelectasis, aortic atherosclerosis, no acute abnormality  Assessment & Plan by Problem:   PAH w/hypoxemia: uncertain etiology, based on ECHO and history alone suspicious for CTEPH but negative CTA on 15 March here at [facility] and then again on 18 March at [facility].  Had PFT's in 2014 but no results available and pt does not know results.    -will need to be discharged on home oxygen -could benefit from [place] and or V/Q scan outpatient -recommend evaluation for OSA -If pt does have Afib, could benefit from anticoagulation which would be helpful for both disease states  Ovarian cancer: last chemo treatment with taxol 3 weeks ago  -management per oncology      Dispo: Admit patient to Observation with expected length of stay less than 2 midnights.  Signed: Katherine Roan, MD 03/14/2018, 4:59 AM

## 2018-03-13 NOTE — Consult Note (Signed)
Palliative Care ED Consultation Reason: Care Coordination, Symptom Management Referred by: Patient, requested PC consultation, d/w EDP  Mrs. Skilton is a 72 yo woman with metastatic serous ovarian cancer who I briefly saw in 2014 after she had intractable nausea and prolonged hospital course following abdominal tumor debulking surgery. Since that time and after initial diagnosis Mrs. Bourn has done exceptionally well and is regularly followed at [facility]. About three weeks ago, I received a call from her daughter Carlen Rebuck, who is an Therapist, sports and personal acquaintance, that her mom had another recurrence and was getting "much worse quickly". They were struggling to make the trip to [place] for appointments and chemotherapy and wanted a second opinion from somewhere closer to home. They are from [place] and requested assistance seeing someone with [facility] in [facility] for oncology services. I spoke with Dr. Marin Olp who graciously agreed to see her for a second opinion and offered to provide her treatment in his office if that was more convienent and their choice.  Today, she had an echocardiogram done at [facility], but due to worsening shortness of breath and low O2 saturations was advised to go to the ED for evaluation. Mrs. Greb tells me that she has difficulty walking up one flight of steps, she is now short of breath even when at rest, her fatigue and weakness is severe, she wakes up at night with a gasping sensation and has trouble lying flat, additionally she complains of her legs feeling like they ache and are "crawling", they also itch and cause her significant discomfort. She has tried increasing her trazadone, using benedryl and topical steroids/creams for pain relief but nothing has helped. She notes that she got a rash on her lower legs after starting megace and believes many of her symptoms started and are related to that even though she has been off megace for 4 weeks.She also has  worsening of a rash on her left breast/it is indurated and painful intermittently-apparently has had some work up at [facility].  Her last chemotherapy was 3/18, respiratory symptoms started about a week after receiving platinum based chemotherapy on 2/19. Per ED she is now hypoxic requiring 3L nasal cannula oxygen. She had a CTscan that did not show PE or pathology to explain her hypoxia on 3/18. 2D echo showed significant pulmonary hypertension with no known etiology. Her EKG is abnormal with inverted T Waves, Prolonged QT-there is a questionable hx or dx of Afib noted by Dr. Marin Olp.   Goals of Care: 1. I spoke with Mr. And Mrs. Lieser this evening- I explained to them current medical information and also discussed possible illness trajectories and work up options ahead. They do understand that her cancer is not curable, but may still be controllable, this is her third recurrence and it does seem it is becoming more difficult to control with shorter remission periods and decreased tolerability of standard chemotherapy. I explained there are according to Dr. Antonieta Pert note many other treatment options. Additionally I explained my role as palliative care was to support them through this illness- to help her feel as good as possible so she can get to treatment if desired, live well and to plan ahead for any future needs including crisis events. I will discuss completing advance directives.  Full Scope Medical Treatment  Full Code  Pulmonary and Cardiology follow-up and consultation  They tell me they want to transfer care to [place] [facility]  Palliative will  follow only as needed   2. Symptom Management:  Dyspnea: Complex and Multifactorial: She has unexplained pulmonary Hypertension, but no PE at least two days ago- her IVC is very dilated on 2D echo. ?Afib noted, no obvious abnormalities on her chest Xray-no effusions or PNA. She does have some ground glass  appearance noted on CT read from 3/18. No fevers, +cough and is just "getting over a respiratory viral illness" may be brewing early post viral PNA, but wouldn't explain PAH. The supplemental O2 is helping- she will need this at home. D/w EDP and requesting pulmonology consult or outpatient follow.Her 12 lead is abnormal. Need to follow this. LDH up- concerning and a poor prognostic factor in malignancy. Gasping may be related to phrenic nerve irritation if metastatic  tumor is in that area- clinically she doesn't appear to have significant ascites.  Leg Discomfort: Mostly bilateral and appears to be venous insufficiency like with itching and capillaritis - I suspect there is also a component of neuropathy from chemotherapy. Could start low dose gabapentin trial. Topical traimcinolone and gentle compression stockings. If continues and benefits outweigh risks we could treat with trial of RLS meds or cymbalta.  Breast Erythema: Very concerning for possible inflammatory breat cancer-this needs complete evaluation- appears this may have been started at [facility] but records are unclear about follow-up.  Will follow inpatient as needed and help with post-discharge care coordination if needed.  Lane Hacker, DO Palliative Medicine [phone]  Time: 50 minutes Greater than 50%  of this time was spent counseling and coordinating care related to the above assessment and plan.

## 2018-03-13 NOTE — Progress Notes (Signed)
2D Echocardiogram has been performed.  Mary Osborn 03/13/2018, 10:16 AM

## 2018-03-14 ENCOUNTER — Encounter (HOSPITAL_COMMUNITY): Payer: Self-pay

## 2018-03-14 ENCOUNTER — Telehealth: Payer: Self-pay | Admitting: *Deleted

## 2018-03-14 ENCOUNTER — Other Ambulatory Visit: Payer: Self-pay

## 2018-03-14 DIAGNOSIS — R0902 Hypoxemia: Secondary | ICD-10-CM

## 2018-03-14 DIAGNOSIS — I272 Pulmonary hypertension, unspecified: Secondary | ICD-10-CM

## 2018-03-14 DIAGNOSIS — E039 Hypothyroidism, unspecified: Secondary | ICD-10-CM | POA: Diagnosis not present

## 2018-03-14 DIAGNOSIS — Z882 Allergy status to sulfonamides status: Secondary | ICD-10-CM

## 2018-03-14 DIAGNOSIS — I878 Other specified disorders of veins: Secondary | ICD-10-CM

## 2018-03-14 DIAGNOSIS — I270 Primary pulmonary hypertension: Secondary | ICD-10-CM | POA: Diagnosis not present

## 2018-03-14 DIAGNOSIS — I872 Venous insufficiency (chronic) (peripheral): Secondary | ICD-10-CM

## 2018-03-14 DIAGNOSIS — J9611 Chronic respiratory failure with hypoxia: Secondary | ICD-10-CM

## 2018-03-14 DIAGNOSIS — N6022 Fibroadenosis of left breast: Secondary | ICD-10-CM | POA: Diagnosis present

## 2018-03-14 DIAGNOSIS — Z9221 Personal history of antineoplastic chemotherapy: Secondary | ICD-10-CM

## 2018-03-14 DIAGNOSIS — Z79890 Hormone replacement therapy (postmenopausal): Secondary | ICD-10-CM | POA: Diagnosis not present

## 2018-03-14 DIAGNOSIS — R9431 Abnormal electrocardiogram [ECG] [EKG]: Secondary | ICD-10-CM | POA: Diagnosis present

## 2018-03-14 DIAGNOSIS — I4581 Long QT syndrome: Secondary | ICD-10-CM

## 2018-03-14 DIAGNOSIS — M79606 Pain in leg, unspecified: Secondary | ICD-10-CM

## 2018-03-14 DIAGNOSIS — Z91048 Other nonmedicinal substance allergy status: Secondary | ICD-10-CM

## 2018-03-14 DIAGNOSIS — Z885 Allergy status to narcotic agent status: Secondary | ICD-10-CM

## 2018-03-14 DIAGNOSIS — Z8679 Personal history of other diseases of the circulatory system: Secondary | ICD-10-CM

## 2018-03-14 DIAGNOSIS — Z79899 Other long term (current) drug therapy: Secondary | ICD-10-CM

## 2018-03-14 DIAGNOSIS — Z8543 Personal history of malignant neoplasm of ovary: Secondary | ICD-10-CM

## 2018-03-14 DIAGNOSIS — J9691 Respiratory failure, unspecified with hypoxia: Secondary | ICD-10-CM

## 2018-03-14 DIAGNOSIS — C786 Secondary malignant neoplasm of retroperitoneum and peritoneum: Secondary | ICD-10-CM

## 2018-03-14 DIAGNOSIS — G5793 Unspecified mononeuropathy of bilateral lower limbs: Secondary | ICD-10-CM | POA: Diagnosis present

## 2018-03-14 DIAGNOSIS — Z87891 Personal history of nicotine dependence: Secondary | ICD-10-CM

## 2018-03-14 LAB — BASIC METABOLIC PANEL
Anion gap: 10 (ref 5–15)
BUN: 20 mg/dL (ref 6–20)
CALCIUM: 8.6 mg/dL — AB (ref 8.9–10.3)
CHLORIDE: 105 mmol/L (ref 101–111)
CO2: 23 mmol/L (ref 22–32)
CREATININE: 0.71 mg/dL (ref 0.44–1.00)
GFR calc non Af Amer: >60 mL/min (ref >60)
Glucose, Bld: 125 mg/dL — ABNORMAL HIGH (ref 65–99)
Potassium: 3.7 mmol/L (ref 3.5–5.1)
SODIUM: 138 mmol/L (ref 135–145)

## 2018-03-14 LAB — CBC
HCT: 32.4 % — ABNORMAL LOW (ref 36.0–46.0)
Hemoglobin: 10.4 g/dL — ABNORMAL LOW (ref 12.0–15.0)
MCH: 30.1 pg (ref 26.0–34.0)
MCHC: 32.1 g/dL (ref 30.0–36.0)
MCV: 93.6 fL (ref 78.0–100.0)
PLATELETS: 181 10*3/uL (ref 150–400)
RBC: 3.46 MIL/uL — AB (ref 3.87–5.11)
RDW: 16.4 % — AB (ref 11.5–15.5)
WBC: 6.4 10*3/uL (ref 4.0–10.5)

## 2018-03-14 LAB — C-REACTIVE PROTEIN: CRP: 11.9 mg/dL — ABNORMAL HIGH (ref <1.0)

## 2018-03-14 LAB — HEPATIC FUNCTION PANEL
ALBUMIN: 3.1 g/dL — AB (ref 3.5–5.0)
ALK PHOS: 64 U/L (ref 38–126)
ALT: 14 U/L (ref 14–54)
AST: 23 U/L (ref 15–41)
Bilirubin, Direct: <0.1 mg/dL — ABNORMAL LOW (ref 0.1–0.5)
Total Bilirubin: 0.5 mg/dL (ref 0.3–1.2)
Total Protein: 5.7 g/dL — ABNORMAL LOW (ref 6.5–8.1)

## 2018-03-14 LAB — SEDIMENTATION RATE: Sed Rate: 60 mm/hr — ABNORMAL HIGH (ref 0–22)

## 2018-03-14 LAB — MAGNESIUM: Magnesium: 1.8 mg/dL (ref 1.7–2.4)

## 2018-03-14 MED ORDER — LEVOTHYROXINE SODIUM 125 MCG PO TABS
125.0000 ug | ORAL_TABLET | Freq: Every day | ORAL | Status: DC
  Filled 2018-03-14: qty 1

## 2018-03-14 NOTE — Progress Notes (Addendum)
Subjective:  Mary Osborn is feeling well today. States her trouble breathing started 6-7 weeks ago. She does not use oxygen at home. Denies chest pain and lower extremity swelling. Discussed with patient will consult Pulmonology for further recommendations. Husband present at bedside. He states Mary Osborn has been doing much better since she was placed on supplemental oxygen in the hospital.  Objective:  Vital signs in last 24 hours: Vitals:   03/13/18 2300 03/13/18 2330 03/14/18 0110 03/14/18 0613  BP: 108/77 (!) 112/92 (!) 90/57 119/81  Pulse: 97 (!) 103 (!) 103 95  Resp: (!) 26 (!) 29 (!) 21 (!) 24  Temp:   98.3 F (36.8 C) 98 F (36.7 C)  TempSrc:   Oral Oral  SpO2: 93% 94% 95% 94%  Weight:   169 lb 8.5 oz (76.9 kg)   Height:   5' (1.524 m)    GEN: Well-appearing, well-nourished very pleasant female lying in bed in NAD. Alert and oriented. RESP: Clear to auscultation bilaterally. No wheezes, rales, or rhonchi. No increased work of breathing, on 2L [place]. CV: Normal rate and regular rhythm. No murmurs, gallops, or rubs. Trace LE edema. ABD: Soft. Non-tender. Non-distended. Normoactive bowel sounds. EXT: Trace BLE edema. Warm and well perfused. NEURO: Cranial nerves II-XII grossly intact. Able to lift all four extremities against gravity. No apparent audiovisual hallucinations. Speech fluent and appropriate. PSYCH: Patient is calm and pleasant. Appropriate affect. Well-groomed; speech is appropriate and on-subject.  Assessment/Plan:  Active Problems:   Pulmonary hypertension (HCC)   Abnormal EKG - T wave inversions, Prolonged QT   On antineoplastic chemotherapy   Neuropathic pain, leg, bilateral   Venous insufficiency of both lower extremities   Diffuse fibroadenosis of left breast   Peritoneal metastases (HCC)   Respiratory failure, unspecified with hypoxia ([place])  Mary Osborn is a 72yo female with PMH of metastatic serous ovarian cancer diagnosed in 2014 s/p abdominal  tumor debulking surgery and chemotherapy (carboplatin/paclitaxel) and ?PAF who presents with 6 weeks of worsening dyspnea. Recent echo showed PAH with normal LVEF and negative CT-A (most recently on 03/18). On review of echo report, outpatient oncologist advised family to present to the ED. Her dyspnea is significantly improved on supplemental oxygen. Will need outpatient Pulm f/u for continued work-up of PAH.  Chronic respiratory failure 2/2 pulmonary HTN Unclear etiology. Trop negative, EKG without acute ischemic changes. Recent echo showed PAH and normal LVEF. Suspicious for CTEPH given active malignancy, but has had two recent CT-As negative for PE. PFTs in 2014, unable to review report. Dyspnea significantly improved with supplemental oxygen. Discussed with Pulm who recommended that most of this is outpatient work-up, however they will evaluate her and determine if anything else needs to be done while here. Ambulatory sats show drop to 89% while ambulating on RA with improvement to 95% on 2L O2. Will need home oxygen - Pulm consulted for recommendations on inpatient vs outpatient work-up; appreciate their assistance - CM consulted for assistance with home oxygen - Will hopefully be able to follow-up with Pulmonology here for continued eval (could benefit from [place], V/Q scan, or sleep study for OSA) - If pt does have Afib, could benefit from anticoagulation which would be helpful for both disease states  Recurrent Stage IIIC serous ovarian cancer, diagnosed in 2014 S/p ex-lap/TAH/BSO with sigmoid resection and re-anastomosis, left pelvic node dissection, and omentectomy in 06/2013, as well as 6 cycles of carboplatin/paclitaxel. In 2016, she developed early peritoneal carcinomatosis and started on treatment with letrozole,  then changed to megestrol in 11/2017. Re-started on carboplatin/paclitaxel every 3 weeks, has completed 1 cycle on 02/21/2018. Was due for next cycle on 03/18, but due to worsening  dyspnea on exertion, this was delayed. Follows with [place] but having a more difficult time making the drive to those appointments, so transitioning her care to [facility] with Dr. Marin Olp. - Palliative Care consulted; appreciate their assistance - Follow up with Oncology  ?PAF Unclear diagnosis. Currently in NSR. Not on anticoagulation or rate/rhythm control. - If she does have afib, could benefit from anticoagulation  Hypothyroidism - Continue home levothyroxine 178mcg daily  Dispo: Anticipated discharge in approximately 0-1 day(s).   Mary Ewing, MD 03/14/2018, 6:47 AM Pager: Mamie Nick [phone]

## 2018-03-14 NOTE — Consult Note (Addendum)
Name: Mary Osborn MRN: [id number] DOB: 27-Apr-1946    ADMISSION DATE:  03/13/2018 CONSULTATION DATE:  3/21  REFERRING MD :  Graciella Freer   CHIEF COMPLAINT:  Dyspnea, Pulm HTN   BRIEF PATIENT DESCRIPTION: 72yo female with hx recurrent metastatic stage III ovarian ca dx 2014 s/p chemotherapy (last rx 3 weeks ago), Probable PAF presented 3/20 with 6 week hx progressive SOB with exertion.  An outpt echo was performed to evaluate her dyspnea which revealed Pulm HTN with PASP 64mmHg and her oncologist called and told her to come to ER right away.   SIGNIFICANT EVENTS    STUDIES:  CTA chest 3/18 ([facility])>> 1. No evidence of pulmonary embolism.  2. Minimal scattered linear and groundglass opacities likely representing  atelectasis or scar.  3. Persistent ascites and sequela of known peritoneal neoplastic disease. 2D echo 3/20>>>- 1. Left ventricular systolic function is preserved visually   estimated at 60-55%. Left ventricular relaxation.   2. Moderate tricuspid regurgitation.   3. Calculated pulmonary artery systolic pressure approximately 70   mmHg. Significant pulmonary hypertension..   4. Right ventricle exhibits hypertrophy and is dilated and   systolic function appears reduced.   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   HISTORY OF PRESENT ILLNESS:  72yo female with hx metastatic ovarian ca dx 2014 s/p chemotherapy (last rx 3 weeks ago) presented 3/20 with 6 week hx progressive SOB with exertion.  An outpt echo was performed to evaluate her dyspnea which revealed Pulm HTN with PASP 44mmHg and her oncologist called and told her to come to ER right away.   In ER no acute changes in her SOB.  She was originally scheduled to be d/c for outpt PAH w/u but had mild hypoxemia at rest with sats 89%.   Denies chest pain, hemoptysis, cough, purulent sputum, fever, leg/calf pain.     PAST MEDICAL HISTORY :   has a past medical history of Cancer ([place]).  has a past surgical history that includes Abdominal  hysterectomy. Prior to Admission medications   Medication Sig Start Date End Date Taking? Authorizing Provider  aspirin EC 81 MG tablet Take 81 mg by mouth daily.   Yes [provider]  atorvastatin (LIPITOR) 20 MG tablet Take 20 mg by mouth. Take 20 mg by mouth twice a week.   Yes [provider]  b complex vitamins tablet Take 1 tablet by mouth daily.   Yes [provider]  Coenzyme Q10 (CO Q-10 PO) Take 10 mg by mouth daily.   Yes [provider]  dexamethasone (DECADRON) 4 MG tablet Take 8 mg by mouth 2 (two) times daily with a meal.   Yes [provider]  docusate sodium (STOOL SOFTENER) 250 MG capsule Take 250 mg by mouth 2 (two) times daily.   Yes [provider]  levothyroxine (SYNTHROID, LEVOTHROID) 125 MCG tablet Take 125 mcg by mouth daily before breakfast.   Yes [provider]  magnesium hydroxide (MILK OF MAGNESIA) 400 MG/5ML suspension Take by mouth daily as needed for mild constipation. Take by mouth once daily as needed for constipation.   Yes [provider]  magnesium oxide (MAG-OX) 400 MG tablet Take 400 mg by mouth daily.   Yes [provider]  meloxicam (MOBIC) 15 MG tablet Take 15 mg by mouth at bedtime.    Yes [provider]  Misc Natural Products (OSTEO BI-FLEX JOINT SHIELD PO) Take 1 capsule by mouth at bedtime.    Yes [provider]  Multiple  Vitamin (MULTIVITAMIN) tablet Take 1 tablet by mouth daily.   Yes [provider]  OLANZapine (ZYPREXA) 10 MG tablet Take 10 mg by mouth at bedtime.   Yes [provider]  omega-3 fish oil (MAXEPA) 1000 MG CAPS capsule Take 1 capsule by mouth daily.   Yes [provider]  prochlorperazine (COMPAZINE) 10 MG tablet Take 10 mg by mouth every 6 (six) hours as needed for nausea or vomiting.   Yes [provider]  sertraline (ZOLOFT) 50 MG tablet Take 50 mg by mouth at bedtime.    Yes [provider]  traZODone (DESYREL) 100 MG tablet Take 100 mg by mouth at bedtime.   Yes [provider]   Allergies  Allergen Reactions  . Codeine Nausea And Vomiting, Nausea Only and Swelling  . Meperidine Nausea And Vomiting  . Sulfa Antibiotics Itching and Swelling  . Sulfamethoxazole Hives  . Sulfur Nausea Only    FAMILY HISTORY:  family history is not on file. SOCIAL HISTORY:  reports that she quit smoking about 10 years ago. Her smoking use included cigarettes. She smoked 1.00 pack per day. She has never used smokeless tobacco. She reports that she does not use drugs.  REVIEW OF SYSTEMS:   As per HPI - All other systems reviewed and were neg.    SUBJECTIVE:   VITAL SIGNS: Temp:  [97.5 F (36.4 C)-98.3 F (36.8 C)] 98 F (36.7 C) (03/21 1779) Pulse Rate:  [92-103] 95 (03/21 0613) Resp:  [16-29] 24 (03/21 3903) BP: (90-124)/(57-92) 119/81 (03/21 0092) SpO2:  [89 %-97 %] 94 % (03/21 0613) Weight:  [76.9 kg (169 lb 8.5 oz)] 76.9 kg (169 lb 8.5 oz) (03/21 0110)  PHYSICAL EXAMINATION: General:  chronically ill appearing female, NAD  Neuro:  Awake, alert, appropriate  HEENT:  Mm moist, no JVD  Cardiovascular:  s1s2 rrr Lungs:  resps even non labored on [place]  Abdomen:  Round, soft, +bs  Musculoskeletal:  Warm and dry, no sig edema   Recent Labs  Lab 03/08/18 1544 03/13/18 1548 03/14/18 0335  NA 142 136 138  K 4.0 4.0 3.7  CL 106 104 105  CO2 31 21* 23  BUN 14 16 20   CREATININE 0.80 0.82 0.71  GLUCOSE 107 129* 125*   Recent Labs  Lab 03/08/18 1544 03/13/18 1548 03/14/18 0335  HGB  --  11.7* 10.4*  HCT 35.9 36.4 32.4*  WBC 5.4 7.0 6.4  PLT 283 236 181   Dg Chest 2 View  Result Date: 03/13/2018 CLINICAL DATA:  Shortness of breath for 6 weeks. EXAM: CHEST - 2 VIEW COMPARISON:  CT chest 03/08/2018. FINDINGS: Minimal subsegmental atelectasis left lung base noted. The lungs are otherwise clear. Heart size is normal. No pneumothorax or pleural  effusion. Aortic atherosclerosis is seen. No acute bony abnormality. IMPRESSION: No acute disease. Atherosclerosis. Electronically Signed   By: Inge Rise M.D.   On: 03/13/2018 16:44    ASSESSMENT / PLAN:  Hypoxia  PAH - PASP 26mmHg. ?chronic thromboembolic disease given active malignancy but CTA neg for PE. No known hx autoimmune disease, OSA, CHF.  ?PAF - currently in NSR  Recurrent metastatic ovarian cancer - current chemotherapy   PLAN  outpt pulm w/u -- appt made with Dr. Lamonte Sakai on 04/02/17  Will go ahead and order autoimmune labs  Home O2 - ambulatory desat study noted  F/u CXR   Nickolas Madrid, NP 03/14/2018  2:04 PM Pager: [phone] or [phone]  Attending Note:  72 year old female with ovarian cancer who presents to the hospital from oncologist office for evaluation of her pulmonary HTN that was noted on an echo ordered by H/O MD.  On exam, CTA bilaterally, no clubbing.  I reviewed chest CT myself, no PE noted.  Discussed with PCCM-NP and Dr. Hilma Favors.  Pulmonary HTN:  - O2 is the only treatment for now  - Autoimmune work-up sent  Hypoxemia:  - Titrate O2 of sat of 90% given pulmonary HTN  PAF:  - Rate control to avoid pulmonary edema  PCCM will sign off, please call back if needed.  Patient seen and examined, agree with above note.  I dictated the care and orders written for this patient under my direction.  Rush Farmer, [place]

## 2018-03-14 NOTE — Telephone Encounter (Addendum)
Patient's daughter is aware of results.   ----- Message from Volanda Napoleon, MD sent at 03/13/2018  2:31 PM EDT ----- Call - your heart is pumping great!!!  No issues except the atrial fibrillation!!!  Mary Osborn

## 2018-03-14 NOTE — Progress Notes (Signed)
SATURATION QUALIFICATIONS: (This note is used to comply with regulatory documentation for home oxygen)  Patient Saturations on Room Air at Rest = 91%  Patient Saturations on Room Air while Ambulating = 85%  Patient Saturations on 2 Liters of oxygen while Ambulating = 96%  Please briefly explain why patient needs home oxygen:    Patient walked a greater distance than previously.  Saturations dropped to 85% and patient had to sit before continuing back to room.

## 2018-03-14 NOTE — Progress Notes (Signed)
SATURATION QUALIFICATIONS: (This note is used to comply with regulatory documentation for home oxygen)  Patient Saturations on Room Air at Rest = 91%  Patient Saturations on Room Air while Ambulating = 89%  Patient Saturations on 2 Liters of oxygen while Ambulating = 95%  Please briefly explain why patient needs home oxygen:       Patient quickly gets short of breath and weak in the legs while ambulating a short distance.

## 2018-03-14 NOTE — Care Management Note (Addendum)
Case Management Note  Patient Details  Name: Mary Osborn MRN: [id number] Date of Birth: 10/28/1946  Subjective/Objective:  From home with spouse  Who will be with her 24/7, she also has a daughter who is a Therapist, sports, who has taken some time off work to be with her mother, per husband she does not need [place] services but she needs home oxygen, has hx of   ovarian cancer diagnosed 2014 s/p chemotherapy treatment 3 meds over last 5 years, last treatment 3 weeks ago, ?PAF.  She presents with a 6 week history of shortness of breath with exertion. RN did ambulation saturations, which did not qualify patient for oxygen, NCM informed RN to recheck ambulation saturation again. NCM spoke with husband he chose [facility] for home oxygen.  Referral given to [facility] with [facility].  She will have the oxygen brought to up to patient's room.                   Action/Plan: DC home when medically ready.   Expected Discharge Date:  03/16/18               Expected Discharge Plan:  Home/Self Care  In-House Referral:     Discharge planning Services  CM Consult  Post Acute Care Choice:    Choice offered to:     DME Arranged:    DME Agency:     HH Arranged:    HH Agency:     Status of Service:  In process, will continue to follow  If discussed at Long Length of Stay Meetings, dates discussed:    Additional Comments:  Zenon Mayo, RN 03/14/2018, 9:33 AM

## 2018-03-14 NOTE — Discharge Summary (Signed)
Name: Mary Osborn MRN: [id number] DOB: [birth year] 72 y.o. PCP: Lemar Livings., MD  Date of Admission: 03/13/2018  3:38 PM Date of Discharge: 03/14/2018 Attending Physician: Dr. Joni Reining  Discharge Diagnosis: 1. Chronic respiratory failure 2/2 2. Pulmonary hypertension 3. Recurrent serous ovarian cancer 4. Questionable history of paroxysmal a. fib  Active Problems:   Pulmonary hypertension (HCC)   Abnormal EKG - T wave inversions, Prolonged QT   On antineoplastic chemotherapy   Neuropathic pain, leg, bilateral   Venous insufficiency of both lower extremities   Diffuse fibroadenosis of left breast   Peritoneal metastases (HCC)   Respiratory failure, unspecified with hypoxia [facility])   Discharge Medications: Allergies as of 03/14/2018      Reactions   Codeine Nausea And Vomiting, Nausea Only, Swelling   Meperidine Nausea And Vomiting   Sulfa Antibiotics Itching, Swelling   Sulfamethoxazole Hives   Sulfur Nausea Only      Medication List    TAKE these medications   aspirin EC 81 MG tablet Take 81 mg by mouth daily.   atorvastatin 20 MG tablet Commonly known as:  LIPITOR Take 20 mg by mouth. Take 20 mg by mouth twice a week.   b complex vitamins tablet Take 1 tablet by mouth daily.   CO Q-10 PO Take 10 mg by mouth daily.   dexamethasone 4 MG tablet Commonly known as:  DECADRON Take 8 mg by mouth 2 (two) times daily with a meal.   levothyroxine 125 MCG tablet Commonly known as:  SYNTHROID, LEVOTHROID Take 125 mcg by mouth daily before breakfast.   magnesium hydroxide 400 MG/5ML suspension Commonly known as:  MILK OF MAGNESIA Take by mouth daily as needed for mild constipation. Take by mouth once daily as needed for constipation.   magnesium oxide 400 MG tablet Commonly known as:  MAG-OX Take 400 mg by mouth daily.   meloxicam 15 MG tablet Commonly known as:  MOBIC Take 15 mg by mouth at bedtime.   multivitamin tablet Take 1 tablet by mouth  daily.   OLANZapine 10 MG tablet Commonly known as:  ZYPREXA Take 10 mg by mouth at bedtime.   omega-3 fish oil 1000 MG Caps capsule Commonly known as:  MAXEPA Take 1 capsule by mouth daily.   OSTEO BI-FLEX JOINT SHIELD PO Take 1 capsule by mouth at bedtime.   prochlorperazine 10 MG tablet Commonly known as:  COMPAZINE Take 10 mg by mouth every 6 (six) hours as needed for nausea or vomiting.   sertraline 50 MG tablet Commonly known as:  ZOLOFT Take 50 mg by mouth at bedtime.   STOOL SOFTENER 250 MG capsule Generic drug:  docusate sodium Take 250 mg by mouth 2 (two) times daily.   traZODone 100 MG tablet Commonly known as:  DESYREL Take 100 mg by mouth at bedtime.      Disposition and follow-up:   Ms.Amare Gilberto was discharged from [facility] in Stable condition.  At the hospital follow up visit please address:  1.  Patient needs outpatient work-up for pulmonary hypertension. Please ensure she attends appointment with Pulmonology (Dr. Lamonte Sakai) on 04/02/2017  2.  She did have an abnormal EKG during her hospitalization, with prolonged QT, poor R wave progression, and nonspecific T wave changes. Consider outpatient cardiology follow up.  3.  Continue outpatient follow up with Oncology for recurrent serous ovarian cancer.  4.  Labs / imaging needed at time of follow-up: Work-up for pulmonary HTN  5.  Pending  labs/ test needing follow-up: Autoimmune work-up (ANA, ESR, CRP, RF, ACE)  Follow-up Appointments: Follow-up Information    Lemar Livings., MD. Schedule an appointment as soon as possible for a visit in 2 week(s).   Specialty:  Family Medicine Contact information: [address] [place] [address] [phone]        Collene Gobble, MD Follow up on 04/02/2018.   Specialty:  Pulmonary Disease Why:  10:45am  Contact information: [address] [place] [address] [phone]        [facility]  Follow up.   Why:  home oxygen Contact information: [place] [address] [phone]          Hospital Course by problem list: Active Problems:   Pulmonary hypertension (HCC)   Abnormal EKG - T wave inversions, Prolonged QT   On antineoplastic chemotherapy   Neuropathic pain, leg, bilateral   Venous insufficiency of both lower extremities   Diffuse fibroadenosis of left breast   Peritoneal metastases (HCC)   Respiratory failure, unspecified with hypoxia (HCC)   1. Chronic respiratory failure 2/2 Pulmonary hypertension Patient reports 6 weeks of worsening dyspnea. She had a recent outpatient echo performed, which showed PAH with normal LVEF. She was called by her oncologist and instructed to present to the ER right away. She was placed on supplemental oxygen with quick improvement in her dyspnea. Etiology of pulmonary hypertension is unclear at this point. She has had two recent CT-As negative for PE. ACS ruled out with reassuring EKG and negative troponin. With history of malignancy, she is at risk for CTEPH as etiology for pulmonary HTN. Echo does not suggest heart failure as contributing cause. Pulmonology was consulted while inpatient and recommended O2 as the only treatment for now while inpatient and continued outpatient work-up. Autoimmune work-up was sent prior to discharge. An appointment was made with Dr. Lamonte Sakai for continued outpatient Pulm eval on 04/02/2017. Her O2 saturations dropped below 88% while ambulating on RA, thus she was discharged with 2L home oxygen.  2. Recurrent stage IIIC serous ovarian cancer, diagnosed in 2014 S/p ex-lap/TAH/BSO with sigmoid resection and re-anastomosis, left pelvic node dissection, and omentectomy in 06/2013, as well as 6 cycles of carboplatin/paclitaxel. In 2016, she developed early peritoneal carcinomatosis and started on treatment with letrozole, then changed to megestrol in 11/2017. Re-started on carboplatin/paclitaxel  every 3 weeks, has completed 1 cycle on 02/21/2018. Was due for next cycle on 03/18, but due to worsening dyspnea on exertion, this was delayed. Follows with [facility] but having a more difficult time making the drive to those appointments, so transitioning her care to [facility] with Dr. Marin Olp. She was encouraged to continue outpatient follow-up with her Oncologist.  3. Possible history of A. Fib Patient has reported history of atrial fibrillation, although we were unable to find any documentation or evidence of this in her chart. It is possible that this may have been a mistake. She was in NSR throughout her hospital stay. No rate control or anticoagulation was added during her hospitalization given no clear documentation of irregular rhythm.  Discharge Vitals:   BP 111/72 (BP Location: Left Arm)   Pulse 94   Temp 97.6 F (36.4 C) (Oral)   Resp 16   Ht 5' (1.524 m)   Wt 169 lb 8.5 oz (76.9 kg)   SpO2 95%   BMI 33.11 kg/m   Pertinent Labs, Studies, and Procedures:  CBC Latest Ref Rng &  Units 03/14/2018 03/13/2018 03/08/2018  WBC 4.0 - 10.5 K/uL 6.4 7.0 5.4  Hemoglobin 12.0 - 15.0 g/dL 10.4(L) 11.7(L) -  Hematocrit 36.0 - 46.0 % 32.4(L) 36.4 35.9  Platelets 150 - 400 K/uL 181 236 283   CMP Latest Ref Rng & Units 03/14/2018 03/13/2018 03/08/2018  Glucose 65 - 99 mg/dL 125(H) 129(H) 107  BUN 6 - 20 mg/dL _0 Creatinine 0.44 - 1.00 mg/dL 0.71 0.82 0.80  Sodium 135 - 145 mmol/L 138 136 142  Potassium 3.5 - 5.1 mmol/L 3.7 4.0 4.0  Chloride 101 - 111 mmol/L 105 104 106  CO2 22 - 32 mmol/L 23 21(L) 31  Calcium 8.9 - 10.3 mg/dL 8.6(L) 9.3 9.7  Total Protein 6.5 - 8.1 g/dL 5.7(L) - 6.6  Total Bilirubin 0.3 - 1.2 mg/dL 0.5 - 0.6  Alkaline Phos 38 - 126 U/L 64 - 76  AST 15 - 41 U/L 23 - 28  ALT 14 - 54 U/L 14 - 19   Troponin 0.05 Mg 1.8 ESR 60 CRP 11.9 ACE 23 (normal) RF 12.7 (normal)  TTE 03/13/2018 Study Conclusions - Left ventricle: The cavity size was normal. Systolic  function was   normal. The estimated ejection fraction was in the range of 60%   to 65%. Wall motion was normal; there were no regional wall   motion abnormalities. - Right ventricle: The cavity size was moderately dilated. Wall   thickness was increased. Systolic function was reduced. - Tricuspid valve: There was moderate regurgitation. - Pulmonary arteries: PA peak pressure: 68 mm Hg (S).  Impressions: - 1. Left ventricular systolic function is preserved visually   estimated at 60-55%. Left ventricular relaxation.   2. Moderate tricuspid regurgitation.   3. Calculated pulmonary artery systolic pressure approximately 70   mmHg. Significant pulmonary hypertension..   4. Right ventricle exhibits hypertrophy and is dilated and   systolic function appears reduced.   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  CT-A chest 03/08/2018 1.  Negative for acute pulmonary embolus. 2. Pulmonary atelectasis and perhaps mild gas trapping. No other pulmonary abnormality. 3. Evidence of omental carcinomatosis in the upper abdomen with small volume ascites. 4. Calcified coronary artery atherosclerosis.  CXR 03/13/2018 No acute disease. Atherosclerosis.  Discharge Instructions: Discharge Instructions    Diet - low sodium heart healthy   Complete by:  As directed    Discharge instructions   Complete by:  As directed    Ms. Carapia,  It was a pleasure to take care of you. While you were in the hospital, it was found that you do need home oxygen. This is to help you breathe due to high blood pressure in your lungs (called pulmonary hypertension). The evaluation for what this is from is done as an outpatient. Please follow up with the lung doctors so that they can continue trying to figure out what the cause of this is.  Please also continue following up with your cancer doctors as scheduled.   Increase activity slowly   Complete by:  As directed      Signed: Colbert Ewing, MD 03/15/2018, 2:19 PM   Pager: Mamie Nick  [phone]

## 2018-03-15 LAB — ANTINUCLEAR ANTIBODIES, IFA: ANTINUCLEAR ANTIBODIES, IFA: NEGATIVE

## 2018-03-15 LAB — RHEUMATOID FACTOR: RHEUMATOID FACTOR: 12.7 [IU]/mL (ref 0.0–13.9)

## 2018-03-15 LAB — ANGIOTENSIN CONVERTING ENZYME: Angiotensin-Converting Enzyme: 23 U/L (ref 14–82)

## 2018-03-18 ENCOUNTER — Other Ambulatory Visit (HOSPITAL_COMMUNITY): Payer: Medicare Other

## 2018-03-18 ENCOUNTER — Telehealth: Payer: Self-pay | Admitting: Internal Medicine

## 2018-03-18 ENCOUNTER — Other Ambulatory Visit: Payer: Self-pay | Admitting: Internal Medicine

## 2018-03-18 MED ORDER — DIAZEPAM 5 MG PO TABS: 5.0000 mg | ORAL_TABLET | Freq: Two times a day (BID) | ORAL | 3 refills | Status: AC | PRN

## 2018-03-18 NOTE — Telephone Encounter (Signed)
Called received from Mary Osborn following her hospitalization at [facility] 3/22. She  Has newly diagnosed Severe Pulmonary Artery Hypertension, is underactive treatment for Ovarian Cancer, she has seen Dr. Marin Olp and wants to establish care with him- it is increasingly difficult for her to travel to [facility]. She is concerned about worsening shortness of breath, activity limitation and progression of her symptoms since leaving the hospital. I believe she needs to be seen before 4/9 by pulmonology and will help get her a follow-up appointment with Dr. Marin Olp who initially ordered the 2D echo.  Lane Hacker, DO Palliative Medicine [phone]

## 2018-03-18 NOTE — Progress Notes (Signed)
Patient called post-discharge complaining of severe bilateral leg pain, difficulty sleeping, describes these as spasms- will have her seen this week by oncology or her PCP.

## 2018-03-20 ENCOUNTER — Telehealth: Payer: Self-pay | Admitting: Emergency Medicine

## 2018-03-20 NOTE — Telephone Encounter (Signed)
Spoke with Dr. Hilma Favors. She is wanting the pt to be scheduled with BQ tomorrow at 11am for a hospital follow up. This appointment is for only 15 mins. Dr. Hilma Favors states that she has been texting back and forth with BQ.  BQ - please advise if you are okay with seeing the pt at 11am tomorrow. Thanks.

## 2018-03-20 NOTE — Telephone Encounter (Signed)
Spoke with Dr. Hilma Favors. She is aware of Dr. Anastasia Pall response. Pt has been scheduled for 03/21/18 at 11am. Nothing further was needed.

## 2018-03-20 NOTE — Telephone Encounter (Signed)
Sure I can do that.

## 2018-03-21 ENCOUNTER — Other Ambulatory Visit: Payer: Self-pay | Admitting: Internal Medicine

## 2018-03-21 ENCOUNTER — Encounter: Payer: Self-pay | Admitting: Internal Medicine

## 2018-03-21 ENCOUNTER — Inpatient Hospital Stay: Payer: Medicare Other | Admitting: Pulmonary Disease

## 2018-03-21 ENCOUNTER — Ambulatory Visit (INDEPENDENT_AMBULATORY_CARE_PROVIDER_SITE_OTHER): Payer: Medicare Other | Admitting: Pulmonary Disease

## 2018-03-21 ENCOUNTER — Telehealth: Payer: Self-pay | Admitting: Internal Medicine

## 2018-03-21 VITALS — BP 117/82 | HR 104

## 2018-03-21 DIAGNOSIS — R0602 Shortness of breath: Secondary | ICD-10-CM | POA: Diagnosis not present

## 2018-03-21 MED ORDER — HYDROCOD POLST-CPM POLST ER 10-8 MG/5ML PO SUER: 5.0000 mL | Freq: Two times a day (BID) | ORAL | 0 refills | Status: AC | PRN

## 2018-03-21 MED ORDER — FUROSEMIDE 20 MG PO TABS: 20.0000 mg | ORAL_TABLET | Freq: Every day | ORAL | 0 refills | Status: AC

## 2018-03-21 MED ORDER — IPRATROPIUM-ALBUTEROL 0.5-2.5 (3) MG/3ML IN SOLN: 3.0000 mL | Freq: Three times a day (TID) | RESPIRATORY_TRACT | 1 refills | Status: AC

## 2018-03-21 MED ORDER — PREDNISONE 20 MG PO TABS: 20.0000 mg | ORAL_TABLET | Freq: Two times a day (BID) | ORAL | 0 refills | Status: AC

## 2018-03-21 NOTE — Telephone Encounter (Signed)
Patient called with worsening leg pain and difficulty resting with severe dyspnea. Pharmacy could not fill her new prescriptions except for lasix and decadron. [facility] to deliver Nebulizer tomorrow. Will send in script for Tussionex- for dyspnea and also mild cough related to possible seasonal allergies-the antihistamine should help. She has tolerated this medication in the past. Newly dx [place] seen today by pulmonology. Palliative care following post discharge from hospitalization for care coordination and symptom management.

## 2018-03-21 NOTE — Progress Notes (Deleted)
Subjective:   PATIENT ID: Mary Osborn GENDER: female DOB: 1946/10/15, MRN: [id number]  Synopsis: Referred in  for pulmonary hypertension.  She has a past medical history significant for ovarian cancer which has been slowly progressive over the years.  She was diagnosed in 2014.  She had a TAH/BSO during which time she was noted to have washings consistent with metastatic disease.  She was treated with carboplatin and paclitaxel and letrozole.  Noted to have recurrent malignancy in 2018 and was started on another treatment regimen with carboplatin and paclitaxel.  HPI  No chief complaint on file.   ***  Past Medical History:  Diagnosis Date  . Cancer [facility])      No family history on file.   Social History   Socioeconomic History  . Marital status: Married    Spouse name: Not on file  . Number of children: Not on file  . Years of education: Not on file  . Highest education level: Not on file  Occupational History  . Not on file  Social Needs  . Financial resource strain: Not on file  . Food insecurity:    Worry: Not on file    Inability: Not on file  . Transportation needs:    Medical: Not on file    Non-medical: Not on file  Tobacco Use  . Smoking status: Former Smoker    Packs/day: 1.00    Types: Cigarettes    Last attempt to quit: 02/22/2008    Years since quitting: 10.0  . Smokeless tobacco: Never Used  Substance and Sexual Activity  . Alcohol use: Not on file  . Drug use: No  . Sexual activity: Not on file  Lifestyle  . Physical activity:    Days per week: Not on file    Minutes per session: Not on file  . Stress: Not on file  Relationships  . Social connections:    Talks on phone: Not on file    Gets together: Not on file    Attends religious service: Not on file    Active member of club or organization: Not on file    Attends meetings of clubs or organizations: Not on file    Relationship status: Not on file  . Intimate partner violence:   Fear of current or ex partner: Not on file    Emotionally abused: Not on file    Physically abused: Not on file    Forced sexual activity: Not on file  Other Topics Concern  . Not on file  Social History Narrative  . Not on file     Allergies  Allergen Reactions  . Codeine Nausea And Vomiting, Nausea Only and Swelling  . Meperidine Nausea And Vomiting  . Sulfa Antibiotics Itching and Swelling  . Sulfamethoxazole Hives  . Sulfur Nausea Only     Outpatient Medications Prior to Visit  Medication Sig Dispense Refill  . aspirin EC 81 MG tablet Take 81 mg by mouth daily.    Marland Kitchen atorvastatin (LIPITOR) 20 MG tablet Take 20 mg by mouth. Take 20 mg by mouth twice a week.    Marland Kitchen b complex vitamins tablet Take 1 tablet by mouth daily.    . Coenzyme Q10 (CO Q-10 PO) Take 10 mg by mouth daily.    Marland Kitchen dexamethasone (DECADRON) 4 MG tablet Take 8 mg by mouth 2 (two) times daily with a meal.    . diazepam (VALIUM) 5 MG tablet Take 1 tablet (5 mg total) by  mouth every 12 (twelve) hours as needed for anxiety. 60 tablet 3  . docusate sodium (STOOL SOFTENER) 250 MG capsule Take 250 mg by mouth 2 (two) times daily.    Marland Kitchen levothyroxine (SYNTHROID, LEVOTHROID) 125 MCG tablet Take 125 mcg by mouth daily before breakfast.    . magnesium hydroxide (MILK OF MAGNESIA) 400 MG/5ML suspension Take by mouth daily as needed for mild constipation. Take by mouth once daily as needed for constipation.    . magnesium oxide (MAG-OX) 400 MG tablet Take 400 mg by mouth daily.    . meloxicam (MOBIC) 15 MG tablet Take 15 mg by mouth at bedtime.     . Misc Natural Products (OSTEO BI-FLEX JOINT SHIELD PO) Take 1 capsule by mouth at bedtime.     . Multiple Vitamin (MULTIVITAMIN) tablet Take 1 tablet by mouth daily.    Marland Kitchen OLANZapine (ZYPREXA) 10 MG tablet Take 10 mg by mouth at bedtime.    Marland Kitchen omega-3 fish oil (MAXEPA) 1000 MG CAPS capsule Take 1 capsule by mouth daily.    . prochlorperazine (COMPAZINE) 10 MG tablet Take 10 mg by mouth  every 6 (six) hours as needed for nausea or vomiting.    . sertraline (ZOLOFT) 50 MG tablet Take 50 mg by mouth at bedtime.     . traZODone (DESYREL) 100 MG tablet Take 100 mg by mouth at bedtime.     No facility-administered medications prior to visit.     ROS    Objective:  Physical Exam   There were no vitals filed for this visit.  ***  CBC    Component Value Date/Time   WBC 6.4 03/14/2018 0335   RBC 3.46 (L) 03/14/2018 0335   HGB 10.4 (L) 03/14/2018 0335   HCT 32.4 (L) 03/14/2018 0335   PLT 181 03/14/2018 0335   PLT 283 03/08/2018 1544   MCV 93.6 03/14/2018 0335   MCH 30.1 03/14/2018 0335   MCHC 32.1 03/14/2018 0335   RDW 16.4 (H) 03/14/2018 0335   LYMPHSABS 1.1 03/08/2018 1544   MONOABS 0.6 03/08/2018 1544   EOSABS 0.2 03/08/2018 1544   BASOSABS 0.0 03/08/2018 1544     Chest imaging: CTA chest 3/18 ([facility])>> 1. No evidence of pulmonary embolism.  2. Minimal scattered linear and groundglass opacities likely representing  atelectasis or scar.  3. Persistent ascites and sequela of known peritoneal neoplastic disease. March 2019 CT angiogram chest performed at [facility]: Some pulmonary vascular engorgement noted, some scattered groundglass in a dependent manner, air trapping, atelectasis noted small volume ascites of the liver and spleen noted, nodularity in the gastrohepatic ligament, nodularity in the greater omentum, splenic granulomas noted calcified  PFT:  Labs: March 2019 blood ANA negative, ACE level negative, rheumatoid factor negative  Path:  Echo: 2D echo 3/20>>>- 1. Left ventricular systolic function is preserved visually estimated at 60-55%. Left ventricular relaxation. 2. Moderate tricuspid regurgitation. 3. Calculated pulmonary artery systolic pressure approximately 70 mmHg. Significant pulmonary hypertension.. 4. Right ventricle exhibits hypertrophy and is dilated and systolic function appears reduced. 5. IVC is  dilated.  Heart Catheterization:       Assessment & Plan:   No diagnosis found.  Discussion: ***    Current Outpatient Medications:  .  aspirin EC 81 MG tablet, Take 81 mg by mouth daily., Disp: , Rfl:  .  atorvastatin (LIPITOR) 20 MG tablet, Take 20 mg by mouth. Take 20 mg by mouth twice a week., Disp: , Rfl:  .  b complex vitamins tablet, Take  1 tablet by mouth daily., Disp: , Rfl:  .  Coenzyme Q10 (CO Q-10 PO), Take 10 mg by mouth daily., Disp: , Rfl:  .  dexamethasone (DECADRON) 4 MG tablet, Take 8 mg by mouth 2 (two) times daily with a meal., Disp: , Rfl:  .  diazepam (VALIUM) 5 MG tablet, Take 1 tablet (5 mg total) by mouth every 12 (twelve) hours as needed for anxiety., Disp: 60 tablet, Rfl: 3 .  docusate sodium (STOOL SOFTENER) 250 MG capsule, Take 250 mg by mouth 2 (two) times daily., Disp: , Rfl:  .  levothyroxine (SYNTHROID, LEVOTHROID) 125 MCG tablet, Take 125 mcg by mouth daily before breakfast., Disp: , Rfl:  .  magnesium hydroxide (MILK OF MAGNESIA) 400 MG/5ML suspension, Take by mouth daily as needed for mild constipation. Take by mouth once daily as needed for constipation., Disp: , Rfl:  .  magnesium oxide (MAG-OX) 400 MG tablet, Take 400 mg by mouth daily., Disp: , Rfl:  .  meloxicam (MOBIC) 15 MG tablet, Take 15 mg by mouth at bedtime. , Disp: , Rfl:  .  Misc Natural Products (OSTEO BI-FLEX JOINT SHIELD PO), Take 1 capsule by mouth at bedtime. , Disp: , Rfl:  .  Multiple Vitamin (MULTIVITAMIN) tablet, Take 1 tablet by mouth daily., Disp: , Rfl:  .  OLANZapine (ZYPREXA) 10 MG tablet, Take 10 mg by mouth at bedtime., Disp: , Rfl:  .  omega-3 fish oil (MAXEPA) 1000 MG CAPS capsule, Take 1 capsule by mouth daily., Disp: , Rfl:  .  prochlorperazine (COMPAZINE) 10 MG tablet, Take 10 mg by mouth every 6 (six) hours as needed for nausea or vomiting., Disp: , Rfl:  .  sertraline (ZOLOFT) 50 MG tablet, Take 50 mg by mouth at bedtime. , Disp: , Rfl:  .  traZODone (DESYREL)  100 MG tablet, Take 100 mg by mouth at bedtime., Disp: , Rfl:

## 2018-03-21 NOTE — Progress Notes (Signed)
Subjective:   PATIENT ID: Mary Osborn GENDER: female DOB: [birth year], MRN: [id number]  Synopsis: Referred in March 2019 for evaluation of pulmonary hypertension: She has a past medical history significant for ovarian cancer which was diagnosed in 2014.  At that time on a TAH/BSO she was noted to have metastatic disease.  She was treated with letrozole as well as carboplatin and paclitaxel.  In 2018 she developed recurrent disease and was treated with carboplatin and paclitaxel again. HPI  Chief Complaint  Patient presents with  . Hospital Follow Up    Was in the hospital for SOB. Currently on 4L, still having SOB and fatigue.    Dyspnea: > this started around the time she needed more treatment for her cancer in 2018 > she was started on megace and around that time she had more dyspnea > they weren't too sure about this at [facility] and she continued to be treated for  > she has been evaluated for blood clots twice in the last month, both of which were negative (CT angiogram chest) > her dyspnea has been progressive  > she has been found to have pulmonary hypertension on an echocardiogram recently > she started on oxygen in the last few weeks > yesterday the dyspnea worsened and she was brought to [facility] ER where her oxygen was adjusted > she has been put on a higher dose of oxygen, now 4L [place] > she is not swelling > she is not gaining weight > no appeite > not coughing > she is extremely dyspneic > she has brought up a little brown mucus > the mucus production started about 2 weeks ago after a bad cold > she was started on an antibiotic > the last dose of carboplatin and paclitaxel was three weeks ago  Past Medical History:  Diagnosis Date  . Cancer [facility])      No family history on file.   Social History   Socioeconomic History  . Marital status: Married    Spouse name: Not on file  . Number of children: Not on file  . Years of education: Not on file  . Highest education  level: Not on file  Occupational History  . Not on file  Social Needs  . Financial resource strain: Not on file  . Food insecurity:    Worry: Not on file    Inability: Not on file  . Transportation needs:    Medical: Not on file    Non-medical: Not on file  Tobacco Use  . Smoking status: Former Smoker    Packs/day: 1.00    Types: Cigarettes    Last attempt to quit: 02/22/2008    Years since quitting: 10.0  . Smokeless tobacco: Never Used  Substance and Sexual Activity  . Alcohol use: Not on file  . Drug use: No  . Sexual activity: Not on file  Lifestyle  . Physical activity:    Days per week: Not on file    Minutes per session: Not on file  . Stress: Not on file  Relationships  . Social connections:    Talks on phone: Not on file    Gets together: Not on file    Attends religious service: Not on file    Active member of club or organization: Not on file    Attends meetings of clubs or organizations: Not on file    Relationship status: Not on file  . Intimate partner violence:    Fear of current or  ex partner: Not on file    Emotionally abused: Not on file    Physically abused: Not on file    Forced sexual activity: Not on file  Other Topics Concern  . Not on file  Social History Narrative  . Not on file     Allergies  Allergen Reactions  . Codeine Nausea And Vomiting, Nausea Only and Swelling  . Meperidine Nausea And Vomiting  . Sulfa Antibiotics Itching and Swelling  . Sulfamethoxazole Hives  . Sulfur Nausea Only     Outpatient Medications Prior to Visit  Medication Sig Dispense Refill  . aspirin EC 81 MG tablet Take 81 mg by mouth daily.    Marland Kitchen atorvastatin (LIPITOR) 20 MG tablet Take 20 mg by mouth. Take 20 mg by mouth twice a week.    Marland Kitchen b complex vitamins tablet Take 1 tablet by mouth daily.    . Coenzyme Q10 (CO Q-10 PO) Take 10 mg by mouth daily.    Marland Kitchen dexamethasone (DECADRON) 4 MG tablet Take 8 mg by mouth 2 (two) times daily with a meal.    . diazepam  (VALIUM) 5 MG tablet Take 1 tablet (5 mg total) by mouth every 12 (twelve) hours as needed for anxiety. 60 tablet 3  . docusate sodium (STOOL SOFTENER) 250 MG capsule Take 250 mg by mouth 2 (two) times daily.    Marland Kitchen levothyroxine (SYNTHROID, LEVOTHROID) 125 MCG tablet Take 125 mcg by mouth daily before breakfast.    . magnesium hydroxide (MILK OF MAGNESIA) 400 MG/5ML suspension Take by mouth daily as needed for mild constipation. Take by mouth once daily as needed for constipation.    . magnesium oxide (MAG-OX) 400 MG tablet Take 400 mg by mouth daily.    . meloxicam (MOBIC) 15 MG tablet Take 15 mg by mouth at bedtime.     . Misc Natural Products (OSTEO BI-FLEX JOINT SHIELD PO) Take 1 capsule by mouth at bedtime.     . Multiple Vitamin (MULTIVITAMIN) tablet Take 1 tablet by mouth daily.    Marland Kitchen OLANZapine (ZYPREXA) 10 MG tablet Take 10 mg by mouth at bedtime.    Marland Kitchen omega-3 fish oil (MAXEPA) 1000 MG CAPS capsule Take 1 capsule by mouth daily.    . prochlorperazine (COMPAZINE) 10 MG tablet Take 10 mg by mouth every 6 (six) hours as needed for nausea or vomiting.    . sertraline (ZOLOFT) 50 MG tablet Take 50 mg by mouth at bedtime.     . traZODone (DESYREL) 100 MG tablet Take 100 mg by mouth at bedtime.     No facility-administered medications prior to visit.     Review of Systems  Constitutional: Positive for malaise/fatigue. Negative for chills, fever and weight loss.  HENT: Negative for congestion, nosebleeds, sinus pain and sore throat.   Eyes: Negative for photophobia, pain and discharge.  Respiratory: Positive for cough, sputum production and shortness of breath. Negative for hemoptysis and wheezing.   Cardiovascular: Positive for leg swelling. Negative for chest pain, palpitations and orthopnea.  Gastrointestinal: Negative for abdominal pain, constipation, diarrhea, nausea and vomiting.  Genitourinary: Negative for dysuria, frequency, hematuria and urgency.  Musculoskeletal: Negative for back  pain, joint pain, myalgias and neck pain.  Skin: Negative for itching and rash.  Neurological: Negative for tingling, tremors, sensory change, speech change, focal weakness, seizures, weakness and headaches.  Psychiatric/Behavioral: Negative for memory loss, substance abuse and suicidal ideas. The patient is not nervous/anxious.       Objective:  Physical  Exam   Vitals:   03/21/18 1538  Pulse: (!) 104  SpO2: 92%    4L [place]  Gen: chronically ill appearing, no acute distress HENT: NCAT, OP clear, neck supple without masses Eyes: PERRL, EOMi Lymph: no cervical lymphadenopathy PULM: CTA B CV: RRR, no mgr, + JVD GI: BS+, soft, nontender, no hsm Derm: trace edema, no rash or skin breakdown MSK: normal bulk and tone Neuro: A&Ox4, CN II-XII intact, strength 5/5 in all 4 extremities Psyche: normal mood and affect   CBC    Component Value Date/Time   WBC 6.4 03/14/2018 0335   RBC 3.46 (L) 03/14/2018 0335   HGB 10.4 (L) 03/14/2018 0335   HCT 32.4 (L) 03/14/2018 0335   PLT 181 03/14/2018 0335   PLT 283 03/08/2018 1544   MCV 93.6 03/14/2018 0335   MCH 30.1 03/14/2018 0335   MCHC 32.1 03/14/2018 0335   RDW 16.4 (H) 03/14/2018 0335   LYMPHSABS 1.1 03/08/2018 1544   MONOABS 0.6 03/08/2018 1544   EOSABS 0.2 03/08/2018 1544   BASOSABS 0.0 03/08/2018 1544   BMET    Component Value Date/Time   NA 138 03/14/2018 0335   K 3.7 03/14/2018 0335   CL 105 03/14/2018 0335   CO2 23 03/14/2018 0335   GLUCOSE 125 (H) 03/14/2018 0335   BUN 20 03/14/2018 0335   CREATININE 0.71 03/14/2018 0335   CREATININE 0.80 03/08/2018 1544   CALCIUM 8.6 (L) 03/14/2018 0335   GFRNONAA >60 03/14/2018 0335   GFRAA >60 03/14/2018 0335     Chest imaging:  March 2019 CT images independently reviewed showing scattered groundglass, some atelectasis, some air trapping, no pulmonary embolism.  PFT:  Labs:  Path:  Echo: March 2019 showed a normal LVEF but an RVSP of 65 mmHg  Heart  Catheterization:   Records from her visit to the emergency room a few days ago reviewed where she was seen for acute respiratory failure, her oxygen level was adjusted to 4 L.    Assessment & Plan:   No diagnosis found.  Discussion: This is a pleasant 72 year old female who comes to my clinic today with rapidly progressive shortness of breath in the setting of pulmonary hypertension.  Objectively she  has evidence of fairly severe pulmonary hypertension on physical exam as well as a recent echocardiogram.  The CT scan of her chest showed no pulmonary embolism though there were some notable pulmonary parenchymal abnormalities with significant air trapping some scattered groundglass and some atelectasis.  The differential diagnosis of what is going on here is broad but she clearly has a fairly severe and rapidly progressive acute because of hypoxemia.  Considering the pulmonary hypertension and normal CT angiograms with the recent addition of Megace in the setting of metastatic malignancy I have a fairly high index of suspicion of chronic thromboembolic pulmonary hypertension.  This could be seen despite a negative CT angiogram of the chest.  Considering all the groundglass and air trapping I worry about bronchiolitis type syndrome, perhaps a complication from 1 of her medications.  In addition, she she does appear to be volume overloaded on physical exam.  Ideally I would like to pursue systematically with a right heart catheterization another test but I am not sure we have time for that given the rapid progression of her symptoms.  Plan: Pulmonary hypertension: Take Lasix 20 mg daily until you see me next We will arrange for a VQ scan to evaluate for chronic thromboembolic pulmonary hypertension Next week I will give  you samples of Lateairis and we will make arrangements for right heart catheterization  Abnormal CT chest: As stated previously the CT scan of your chest showed some air trapping  and scattered areas of inflammation which concerns me for the possibility of toxicity from some of the medicines you have taken recently. Take prednisone 40 mg daily until you see me next Use DuoNeb 3 times a day, we will prescribed today  Chronic respiratory failure with hypoxemia: Continue 4 L of oxygen continuously  Follow-up with me or nurse practitioner on Tuesday of next week in [place]    Current Outpatient Medications:  .  aspirin EC 81 MG tablet, Take 81 mg by mouth daily., Disp: , Rfl:  .  atorvastatin (LIPITOR) 20 MG tablet, Take 20 mg by mouth. Take 20 mg by mouth twice a week., Disp: , Rfl:  .  b complex vitamins tablet, Take 1 tablet by mouth daily., Disp: , Rfl:  .  Coenzyme Q10 (CO Q-10 PO), Take 10 mg by mouth daily., Disp: , Rfl:  .  dexamethasone (DECADRON) 4 MG tablet, Take 8 mg by mouth 2 (two) times daily with a meal., Disp: , Rfl:  .  diazepam (VALIUM) 5 MG tablet, Take 1 tablet (5 mg total) by mouth every 12 (twelve) hours as needed for anxiety., Disp: 60 tablet, Rfl: 3 .  docusate sodium (STOOL SOFTENER) 250 MG capsule, Take 250 mg by mouth 2 (two) times daily., Disp: , Rfl:  .  levothyroxine (SYNTHROID, LEVOTHROID) 125 MCG tablet, Take 125 mcg by mouth daily before breakfast., Disp: , Rfl:  .  magnesium hydroxide (MILK OF MAGNESIA) 400 MG/5ML suspension, Take by mouth daily as needed for mild constipation. Take by mouth once daily as needed for constipation., Disp: , Rfl:  .  magnesium oxide (MAG-OX) 400 MG tablet, Take 400 mg by mouth daily., Disp: , Rfl:  .  meloxicam (MOBIC) 15 MG tablet, Take 15 mg by mouth at bedtime. , Disp: , Rfl:  .  Misc Natural Products (OSTEO BI-FLEX JOINT SHIELD PO), Take 1 capsule by mouth at bedtime. , Disp: , Rfl:  .  Multiple Vitamin (MULTIVITAMIN) tablet, Take 1 tablet by mouth daily., Disp: , Rfl:  .  OLANZapine (ZYPREXA) 10 MG tablet, Take 10 mg by mouth at bedtime., Disp: , Rfl:  .  omega-3 fish oil (MAXEPA) 1000 MG CAPS  capsule, Take 1 capsule by mouth daily., Disp: , Rfl:  .  prochlorperazine (COMPAZINE) 10 MG tablet, Take 10 mg by mouth every 6 (six) hours as needed for nausea or vomiting., Disp: , Rfl:  .  sertraline (ZOLOFT) 50 MG tablet, Take 50 mg by mouth at bedtime. , Disp: , Rfl:  .  traZODone (DESYREL) 100 MG tablet, Take 100 mg by mouth at bedtime., Disp: , Rfl:

## 2018-03-21 NOTE — Patient Instructions (Signed)
Pulmonary hypertension: Take Lasix 20 mg daily until you see me next We will arrange for a VQ scan to evaluate for chronic thromboembolic pulmonary hypertension Next week I will give you samples of Lateairis and we will make arrangements for right heart catheterization  Abnormal CT chest: As stated previously the CT scan of your chest showed some air trapping and scattered areas of inflammation which concerns me for the possibility of toxicity from some of the medicines you have taken recently. Take prednisone 40 mg daily until you see me next Use DuoNeb 3 times a day, we will prescribed today  Chronic respiratory failure with hypoxemia: Continue 4 L of oxygen continuously  Follow-up with me or nurse practitioner on Tuesday of next week in [place]

## 2018-03-22 ENCOUNTER — Ambulatory Visit (HOSPITAL_COMMUNITY): Payer: Medicare Other

## 2018-03-22 ENCOUNTER — Encounter: Payer: Self-pay | Admitting: Pulmonary Disease

## 2018-03-22 ENCOUNTER — Telehealth: Payer: Self-pay | Admitting: Radiology

## 2018-03-22 ENCOUNTER — Inpatient Hospital Stay (HOSPITAL_COMMUNITY)
Admission: AD | Admit: 2018-03-22 | Payer: Self-pay | Source: Other Acute Inpatient Hospital | Admitting: Emergency Medicine

## 2018-03-22 ENCOUNTER — Other Ambulatory Visit: Payer: Self-pay | Admitting: *Deleted

## 2018-03-22 DIAGNOSIS — R0602 Shortness of breath: Secondary | ICD-10-CM

## 2018-03-22 NOTE — Progress Notes (Signed)
vq scan

## 2018-03-25 DEATH — deceased

## 2018-03-26 ENCOUNTER — Ambulatory Visit: Payer: Medicare Other | Admitting: Adult Health

## 2018-03-27 ENCOUNTER — Telehealth: Payer: Self-pay | Admitting: Pulmonary Disease

## 2018-03-27 NOTE — Telephone Encounter (Signed)
Order faxed to [facility] with office note attached again

## 2018-04-02 ENCOUNTER — Inpatient Hospital Stay: Payer: Medicare Other | Admitting: Emergency Medicine

## 2019-03-29 IMAGING — CT CT ANGIO CHEST
2 of 8 series · 18 of 36 positions shown · IV contrast (iopamidol)
Comparison: Report of [REDACTED] CT Abdomen and
Pelvis 07/02/2013 (no images available).

CLINICAL DATA: 71-year-old female with metastatic ovarian cancer.
Undergoing chemotherapy, last dose in [REDACTED].. Shortness of
breath.

EXAM:
CT ANGIOGRAPHY CHEST WITH CONTRAST
TECHNIQUE: Multidetector CT imaging of the chest was performed using the
standard protocol during bolus administration of intravenous
contrast. Multiplanar CT image reconstructions and MIPs were
obtained to evaluate the vascular anatomy.
CONTRAST:  100mL FI3RBE-JTO IOPAMIDOL (FI3RBE-JTO) INJECTION 76%

[Series 5: pe thins · axial · 0.73mm/px · z∈[+996,+1244]mm · 17 of 278 slices shown]
[im 15/278  lung]
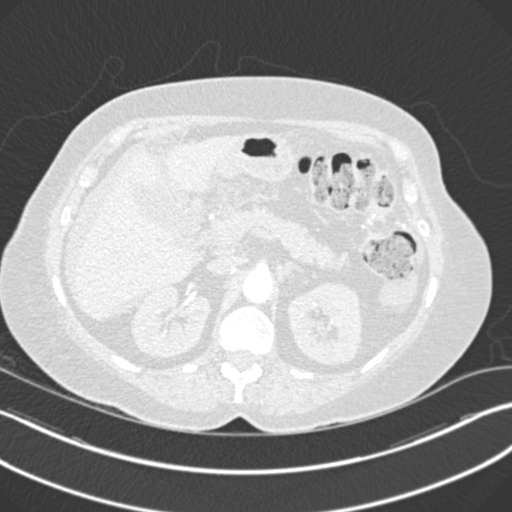
[im 30/278  mediastinal]
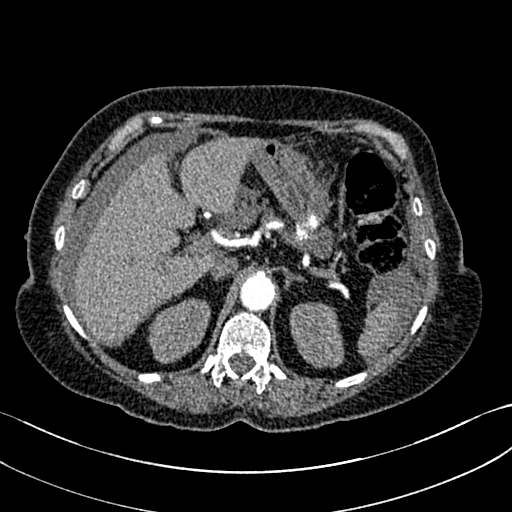
[im 44/278  lung]
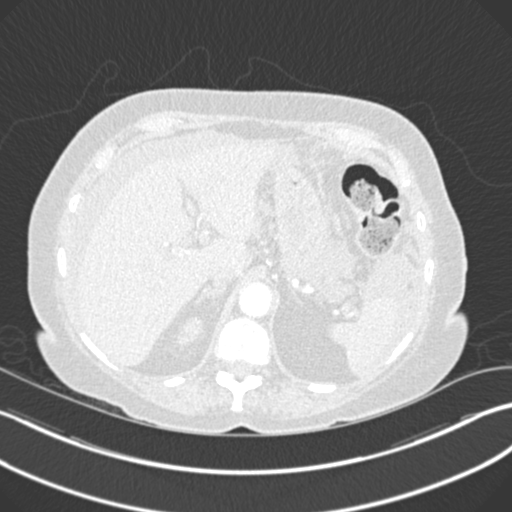
[im 59/278  mediastinal]
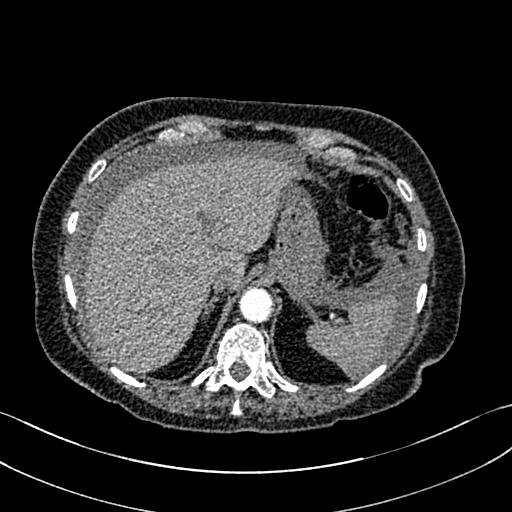
[im 73/278  lung]
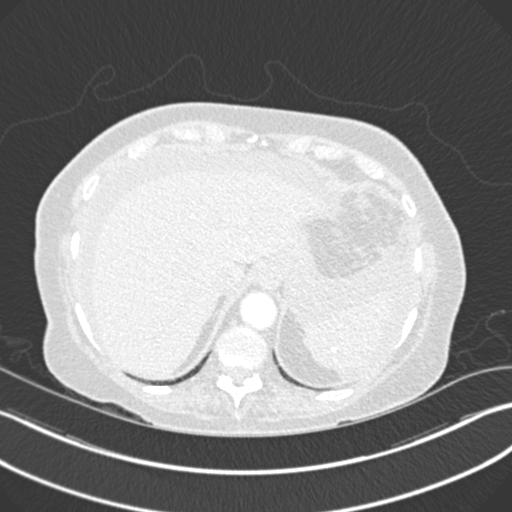
[im 88/278  mediastinal]
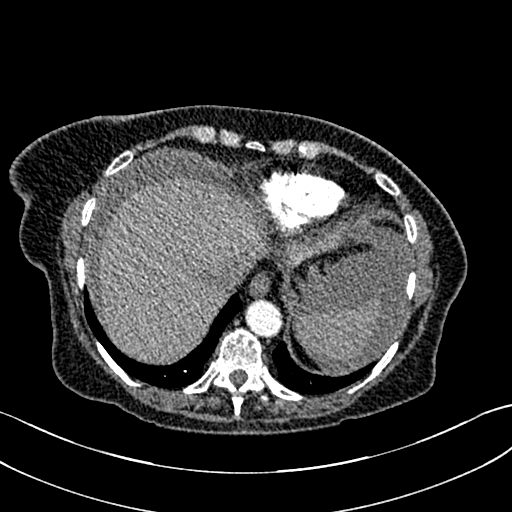
[im 103/278  lung]
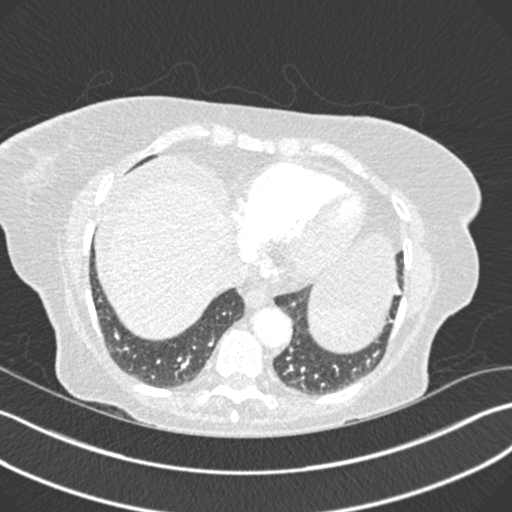
[im 117/278  mediastinal]
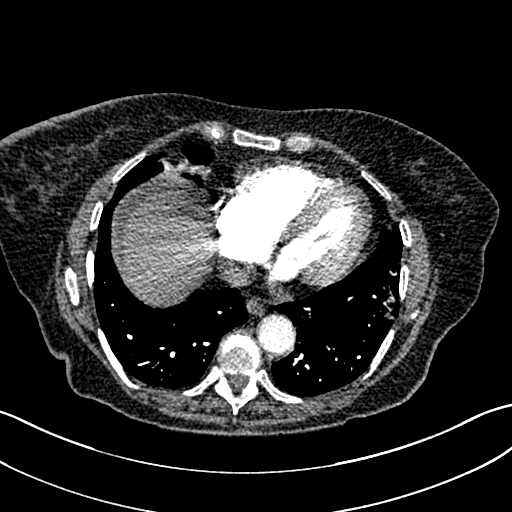
[im 146/278  lung]
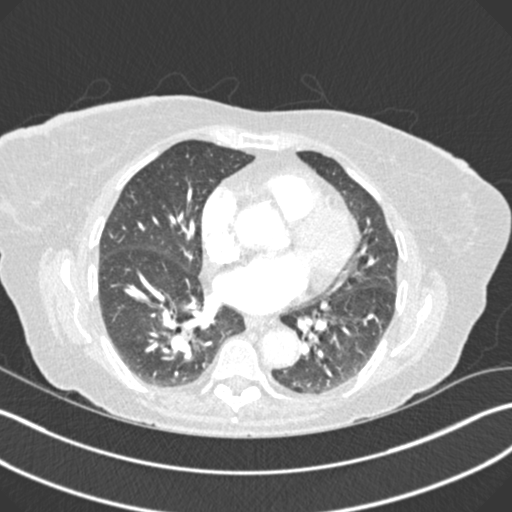
[im 161/278  mediastinal]
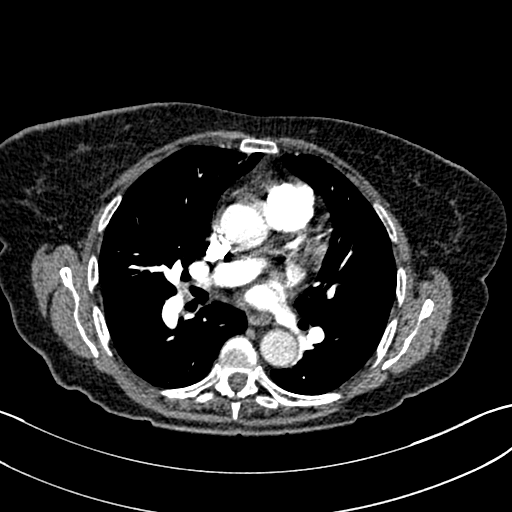
[im 175/278  lung]
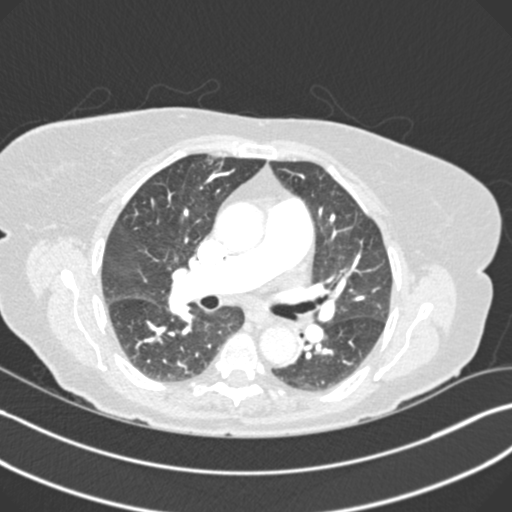
[im 190/278  mediastinal]
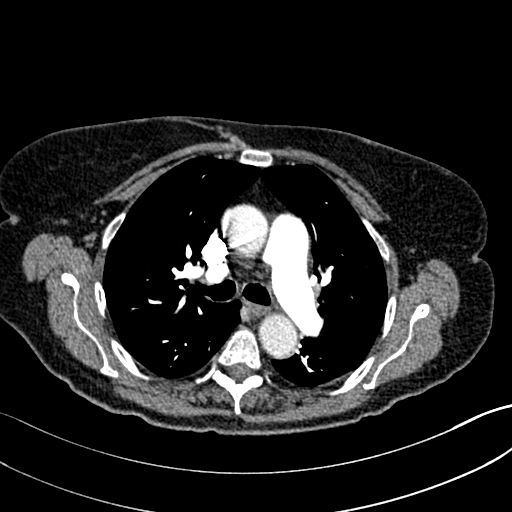
[im 205/278  lung]
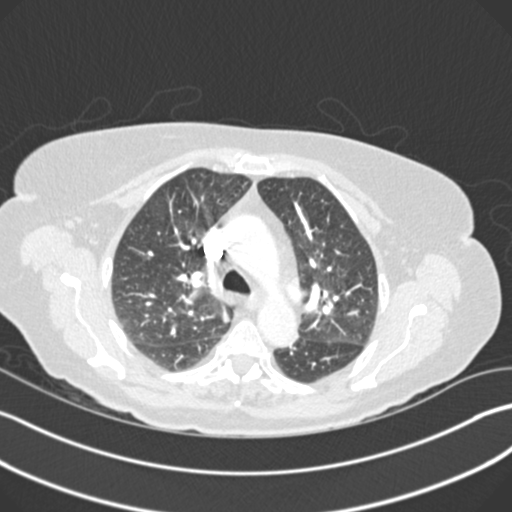
[im 219/278  mediastinal]
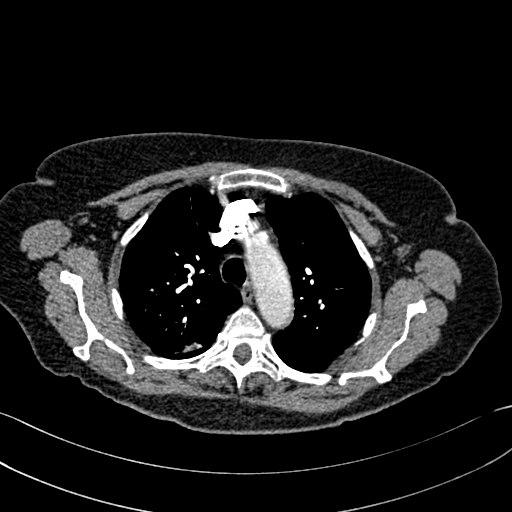
[im 234/278  lung]
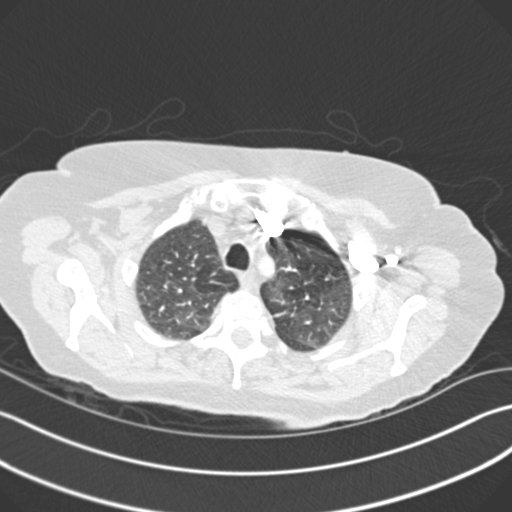
[im 248/278  mediastinal]
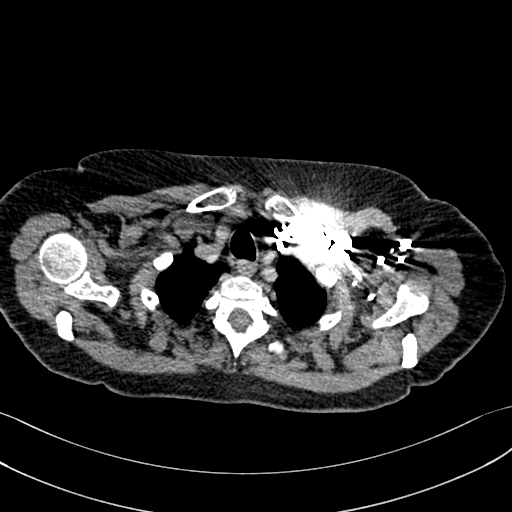
[im 263/278  lung]
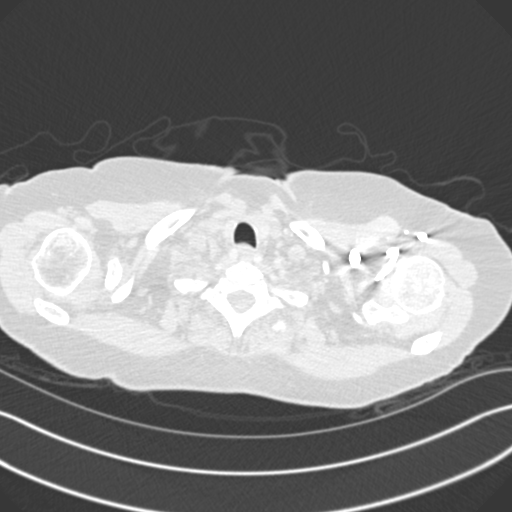

[Series 7: pe coronal mpr · coronal · 0.56mm/px · 1 of 133 slices shown]
[im 67/133  mediastinal]
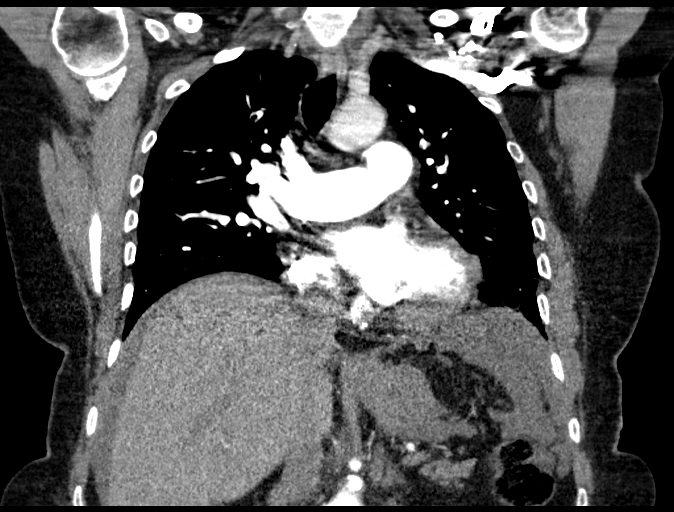

[18 of 36 positions shown; findings below may reference images not displayed]

FINDINGS: Cardiovascular: Good contrast bolus timing in the pulmonary arterial
tree. Mild respiratory motion.

No focal filling defect identified in the pulmonary arteries to
suggest acute pulmonary embolism.

Calcified coronary atherosclerosis is evident. Minor atherosclerosis
in the visible aorta. Borderline to mild cardiomegaly. No
pericardial effusion.

Mediastinum/Nodes: Negative.  No mediastinal lymphadenopathy.

Lungs/Pleura: Major airways are patent. There is mild bilateral
dependent pulmonary atelectasis. Mild mosaic attenuation in the
upper lobes might reflect gas trapping. No pulmonary nodule or mass
identified. No consolidation. No pleural effusion.

Upper Abdomen: Small volume ascites about the liver and spleen.
There is fluid and/or nodularity in the gastrosplenic ligament.
There is nodularity in the gastrohepatic ligament. There is mild
nodularity in the visible greater omentum.

Incidental small splenic calcified granulomas. Negative visible
adrenal glands and kidneys. No dilated bowel in the upper abdomen.

Musculoskeletal: No acute osseous abnormality identified.

Review of the MIP images confirms the above findings.
IMPRESSION: 1.  Negative for acute pulmonary embolus.
2. Pulmonary atelectasis and perhaps mild gas trapping. No other
pulmonary abnormality.
3. Evidence of omental carcinomatosis in the upper abdomen with
small volume ascites.
4. Calcified coronary artery atherosclerosis.
